# Patient Record
Sex: Female | Born: 1990 | Race: Black or African American | Hispanic: No | Marital: Single | Smoking: Former smoker
Health system: Southern US, Community
[De-identification: ages and names within clinical notes are randomized; demographics above are authoritative.]

## PROBLEM LIST (undated history)

## (undated) ENCOUNTER — Inpatient Hospital Stay (HOSPITAL_COMMUNITY): Payer: Self-pay

## (undated) DIAGNOSIS — I10 Essential (primary) hypertension: Secondary | ICD-10-CM

## (undated) DIAGNOSIS — N390 Urinary tract infection, site not specified: Secondary | ICD-10-CM

## (undated) DIAGNOSIS — R519 Headache, unspecified: Secondary | ICD-10-CM

## (undated) HISTORY — DX: Urinary tract infection, site not specified: N39.0

## (undated) HISTORY — DX: Headache, unspecified: R51.9

---

## 2008-07-05 DIAGNOSIS — N390 Urinary tract infection, site not specified: Secondary | ICD-10-CM

## 2008-07-05 HISTORY — DX: Urinary tract infection, site not specified: N39.0

## 2016-06-02 ENCOUNTER — Emergency Department (HOSPITAL_COMMUNITY): Payer: Medicaid - Out of State

## 2016-06-02 ENCOUNTER — Encounter (HOSPITAL_COMMUNITY): Payer: Self-pay | Admitting: Emergency Medicine

## 2016-06-02 ENCOUNTER — Emergency Department (HOSPITAL_COMMUNITY)
Admission: EM | Admit: 2016-06-02 | Discharge: 2016-06-02 | Disposition: A | Discharge status: Home / Self Care | Payer: Medicaid - Out of State | Attending: Emergency Medicine | Admitting: Emergency Medicine

## 2016-06-02 DIAGNOSIS — F17200 Nicotine dependence, unspecified, uncomplicated: Secondary | ICD-10-CM | POA: Insufficient documentation

## 2016-06-02 DIAGNOSIS — N72 Inflammatory disease of cervix uteri: Secondary | ICD-10-CM | POA: Insufficient documentation

## 2016-06-02 LAB — I-STAT CHEM 8, ED
BUN: 6 mg/dL (ref 6–20)
CALCIUM ION: 1.17 mmol/L (ref 1.15–1.40)
CHLORIDE: 103 mmol/L (ref 101–111)
CREATININE: 0.7 mg/dL (ref 0.44–1.00)
GLUCOSE: 113 mg/dL — AB (ref 65–99)
HCT: 36 % (ref 36.0–46.0)
Hemoglobin: 12.2 g/dL (ref 12.0–15.0)
POTASSIUM: 3.1 mmol/L — AB (ref 3.5–5.1)
Sodium: 142 mmol/L (ref 135–145)
TCO2: 27 mmol/L (ref 0–100)

## 2016-06-02 LAB — URINALYSIS, ROUTINE W REFLEX MICROSCOPIC
BILIRUBIN URINE: NEGATIVE
GLUCOSE, UA: NEGATIVE mg/dL
Hgb urine dipstick: NEGATIVE
KETONES UR: NEGATIVE mg/dL
LEUKOCYTES UA: NEGATIVE
NITRITE: NEGATIVE
PH: 6.5 (ref 5.0–8.0)
PROTEIN: NEGATIVE mg/dL
Specific Gravity, Urine: 1.029 (ref 1.005–1.030)

## 2016-06-02 LAB — WET PREP, GENITAL
SPERM: NONE SEEN
TRICH WET PREP: NONE SEEN
Yeast Wet Prep HPF POC: NONE SEEN

## 2016-06-02 LAB — CBC WITH DIFFERENTIAL/PLATELET
BASOS ABS: 0 10*3/uL (ref 0.0–0.1)
Basophils Relative: 0 %
EOS PCT: 3 %
Eosinophils Absolute: 0.1 10*3/uL (ref 0.0–0.7)
HEMATOCRIT: 35.6 % — AB (ref 36.0–46.0)
HEMOGLOBIN: 11.7 g/dL — AB (ref 12.0–15.0)
LYMPHS ABS: 2.2 10*3/uL (ref 0.7–4.0)
LYMPHS PCT: 47 %
MCH: 27.3 pg (ref 26.0–34.0)
MCHC: 32.9 g/dL (ref 30.0–36.0)
MCV: 83.2 fL (ref 78.0–100.0)
Monocytes Absolute: 0.5 10*3/uL (ref 0.1–1.0)
Monocytes Relative: 10 %
NEUTROS ABS: 2 10*3/uL (ref 1.7–7.7)
Neutrophils Relative %: 40 %
Platelets: 199 10*3/uL (ref 150–400)
RBC: 4.28 MIL/uL (ref 3.87–5.11)
RDW: 13.9 % (ref 11.5–15.5)
WBC: 4.9 10*3/uL (ref 4.0–10.5)

## 2016-06-02 LAB — POC URINE PREG, ED: Preg Test, Ur: NEGATIVE

## 2016-06-02 MED ORDER — METRONIDAZOLE 500 MG PO TABS: 500.0000 mg | ORAL_TABLET | Freq: Two times a day (BID) | ORAL | 0 refills | Status: DC

## 2016-06-02 MED ORDER — AZITHROMYCIN 250 MG PO TABS
1000.0000 mg | ORAL_TABLET | Freq: Once | ORAL | Status: AC
  Administered 2016-06-02: 1000 mg via ORAL
  Filled 2016-06-02: qty 4

## 2016-06-02 MED ORDER — LIDOCAINE HCL 1 % IJ SOLN
INTRAMUSCULAR | Status: AC
  Administered 2016-06-02: 1 mL
  Filled 2016-06-02: qty 20

## 2016-06-02 MED ORDER — CEFTRIAXONE SODIUM 250 MG IJ SOLR
250.0000 mg | Freq: Once | INTRAMUSCULAR | Status: AC
  Administered 2016-06-02: 250 mg via INTRAMUSCULAR
  Filled 2016-06-02: qty 250

## 2016-06-02 NOTE — ED Triage Notes (Signed)
Patient reports left side lower back pain radiating to left flank x3 days. Patient also reports vaginal discharge x1 month. Denies urinary frequency and pain with urination.

## 2016-06-02 NOTE — ED Provider Notes (Signed)
[facility]-EMERGENCY DEPT Provider Note   CSN: [id number] Arrival date & time: 06/02/16  1340     History   Chief Complaint Chief Complaint  Patient presents with  . Flank Pain  . Vaginal Discharge    HPI Tamara Cowan is a 25 y.o. female.  HPI Patient presents with left flank pain. Began around 5 days ago. Began the left lower back and worked its way to the abdomen. Worse with trying to sit up. Slight vaginal discharge. No pain. No dysuria. States it feels somewhat like when she had a kidney infection the past. She's had no dysuria. No nausea vomiting. No chills.   History reviewed. No pertinent past medical history.  There are no active problems to display for this patient.   History reviewed. No pertinent surgical history.  OB History    No data available       Home Medications    Prior to Admission medications   Medication Sig Start Date End Date Taking? Authorizing Provider  metroNIDAZOLE (FLAGYL) 500 MG tablet Take 1 tablet (500 mg total) by mouth 2 (two) times daily. 06/02/16   Benjiman CoreNathan Ginny Loomer, MD    Family History History reviewed. No pertinent family history.  Social History Social History  Substance Use Topics  . Smoking status: Light Tobacco Smoker  . Smokeless tobacco: Never Used  . Alcohol use Not on file     Allergies   Patient has no known allergies.   Review of Systems Review of Systems  Constitutional: Negative for appetite change and fever.  HENT: Negative for congestion.   Eyes: Negative for visual disturbance.  Respiratory: Negative for cough.   Cardiovascular: Negative for chest pain.  Gastrointestinal: Positive for abdominal pain.  Endocrine: Negative for polyuria.  Genitourinary: Positive for flank pain. Negative for hematuria.  Musculoskeletal: Positive for back pain.  Skin: Negative for wound.  Neurological: Negative for headaches.  Psychiatric/Behavioral: Negative for confusion.     Physical Exam Updated Vital  Signs BP 128/91   Pulse 74   Temp 98.5 F (36.9 C) (Oral)   Resp 18   Ht 5\' 7"  (1.702 m)   Wt 145 lb (65.8 kg)   LMP 03/22/2016   SpO2 100%   BMI 22.71 kg/m   Physical Exam  Constitutional: She is oriented to person, place, and time. She appears well-developed and well-nourished.  HENT:  Head: Normocephalic and atraumatic.  Eyes: Pupils are equal, round, and reactive to light.  Neck: Normal range of motion. Neck supple.  Cardiovascular: Normal rate, regular rhythm and normal heart sounds.   No murmur heard. Pulmonary/Chest: Effort normal and breath sounds normal. No respiratory distress. She has no wheezes. She has no rales.  Abdominal: Soft. Bowel sounds are normal. She exhibits no distension. There is tenderness. There is no guarding.  Mild left lower quadrant tenderness without rebound or guarding.  Genitourinary: Vaginal discharge found.  Genitourinary Comments: Thick white vaginal discharge. Some left adnexal tenderness. Somewhat difficult to visualize cervix as she is very posterior.  Musculoskeletal: Normal range of motion.  Neurological: She is alert and oriented to person, place, and time. No cranial nerve deficit.  Skin: Skin is warm and dry.  Psychiatric: She has a normal mood and affect. Her speech is normal.  Nursing note and vitals reviewed.    ED Treatments / Results  Labs (all labs ordered are listed, but only abnormal results are displayed) Labs Reviewed  WET PREP, GENITAL - Abnormal; Notable for the following:  Result Value   Clue Cells Wet Prep HPF POC PRESENT (*)    WBC, Wet Prep HPF POC MANY (*)    All other components within normal limits  URINALYSIS, ROUTINE W REFLEX MICROSCOPIC (NOT AT [facility]) - Abnormal; Notable for the following:    APPearance CLOUDY (*)    All other components within normal limits  CBC WITH DIFFERENTIAL/PLATELET - Abnormal; Notable for the following:    Hemoglobin 11.7 (*)    HCT 35.6 (*)    All other components within  normal limits  I-STAT CHEM 8, ED - Abnormal; Notable for the following:    Potassium 3.1 (*)    Glucose, Bld 113 (*)    All other components within normal limits  RPR  HIV ANTIBODY (ROUTINE TESTING)  POC URINE PREG, ED  GC/CHLAMYDIA PROBE AMP ([facility]) NOT AT [facility]    EKG  EKG Interpretation None       Radiology [place] Transvaginal Non-ob  Result Date: 06/02/2016 CLINICAL DATA:  Left lower quadrant pain and left flank pain for 3 days. EXAM: TRANSABDOMINAL AND TRANSVAGINAL ULTRASOUND OF PELVIS TECHNIQUE: Both transabdominal and transvaginal ultrasound examinations of the pelvis were performed. Transabdominal technique was performed for global imaging of the pelvis including uterus, ovaries, adnexal regions, and pelvic cul-de-sac. It was necessary to proceed with endovaginal exam following the transabdominal exam to visualize the endometrium. COMPARISON:  None FINDINGS: Uterus Measurements: 9.1 x 4.2 x 4.8 cm. No fibroids or other mass visualized. Endometrium Thickness: 4 mm.  No focal abnormality visualized. Right ovary Measurements: 3.9 x 2.0 x 2.8 cm. Normal appearance/no adnexal mass. Left ovary Measurements: 2.6 x 1.7 x 2.9 cm. Normal appearance/no adnexal mass. Other findings Small volume pelvic free fluid. IMPRESSION: 1. Normal appearance of the uterus and ovaries. 2. Small volume free fluid, likely physiologic. Electronically Signed   By: Sebastian AcheAllen  Grady M.D.   On: 06/02/2016 19:54   [place] Pelvis Complete  Result Date: 06/02/2016 CLINICAL DATA:  Left lower quadrant pain and left flank pain for 3 days. EXAM: TRANSABDOMINAL AND TRANSVAGINAL ULTRASOUND OF PELVIS TECHNIQUE: Both transabdominal and transvaginal ultrasound examinations of the pelvis were performed. Transabdominal technique was performed for global imaging of the pelvis including uterus, ovaries, adnexal regions, and pelvic cul-de-sac. It was necessary to proceed with endovaginal exam following the transabdominal exam to visualize  the endometrium. COMPARISON:  None FINDINGS: Uterus Measurements: 9.1 x 4.2 x 4.8 cm. No fibroids or other mass visualized. Endometrium Thickness: 4 mm.  No focal abnormality visualized. Right ovary Measurements: 3.9 x 2.0 x 2.8 cm. Normal appearance/no adnexal mass. Left ovary Measurements: 2.6 x 1.7 x 2.9 cm. Normal appearance/no adnexal mass. Other findings Small volume pelvic free fluid. IMPRESSION: 1. Normal appearance of the uterus and ovaries. 2. Small volume free fluid, likely physiologic. Electronically Signed   By: Sebastian AcheAllen  Grady M.D.   On: 06/02/2016 19:54    Procedures Procedures (including critical care time)  Medications Ordered in ED Medications  cefTRIAXone (ROCEPHIN) injection 250 mg (not administered)  azithromycin (ZITHROMAX) tablet 1,000 mg (not administered)     Initial Impression / Assessment and Plan / ED Course  I have reviewed the triage vital signs and the nursing notes.  Pertinent labs & imaging results that were available during my care of the patient were reviewed by me and considered in my medical decision making (see chart for details).  Clinical Course     Patient with back to pelvic pain. Does have vaginal discharge. Possible BV and many  white cells on pelvic exam. Treated for STD culture sent. Labs reassuring. Doubt other severe intra-abdominal infection. Discharge.  Final Clinical Impressions(s) / ED Diagnoses   Final diagnoses:  Cervicitis    New Prescriptions New Prescriptions   METRONIDAZOLE (FLAGYL) 500 MG TABLET    Take 1 tablet (500 mg total) by mouth 2 (two) times daily.     Benjiman Core, MD 06/02/16 2107

## 2016-06-02 NOTE — Discharge Instructions (Signed)
We treated you to cover STDs however cultures are still pending.

## 2016-06-03 LAB — GC/CHLAMYDIA PROBE AMP (~~LOC~~) NOT AT ARMC
Chlamydia: NEGATIVE
Neisseria Gonorrhea: NEGATIVE

## 2016-06-03 LAB — RPR: RPR: NONREACTIVE

## 2016-06-03 LAB — HIV ANTIBODY (ROUTINE TESTING W REFLEX): HIV SCREEN 4TH GENERATION: NONREACTIVE

## 2016-06-28 ENCOUNTER — Inpatient Hospital Stay (HOSPITAL_COMMUNITY)
Admission: AD | Admit: 2016-06-28 | Discharge: 2016-06-28 | Disposition: A | Discharge status: Home / Self Care | Payer: Medicaid - Out of State | Source: Ambulatory Visit | Attending: Obstetrics & Gynecology | Admitting: Obstetrics & Gynecology

## 2016-06-28 ENCOUNTER — Encounter (HOSPITAL_COMMUNITY): Payer: Self-pay | Admitting: Emergency Medicine

## 2016-06-28 ENCOUNTER — Emergency Department (HOSPITAL_COMMUNITY)
Admission: EM | Admit: 2016-06-28 | Discharge: 2016-06-28 | Disposition: A | Discharge status: Home / Self Care | Payer: Medicaid - Out of State | Attending: Emergency Medicine | Admitting: Emergency Medicine

## 2016-06-28 DIAGNOSIS — Z5321 Procedure and treatment not carried out due to patient leaving prior to being seen by health care provider: Secondary | ICD-10-CM | POA: Insufficient documentation

## 2016-06-28 DIAGNOSIS — Z3201 Encounter for pregnancy test, result positive: Secondary | ICD-10-CM | POA: Insufficient documentation

## 2016-06-28 DIAGNOSIS — F17200 Nicotine dependence, unspecified, uncomplicated: Secondary | ICD-10-CM | POA: Insufficient documentation

## 2016-06-28 LAB — POC URINE PREG, ED: PREG TEST UR: POSITIVE — AB

## 2016-06-28 NOTE — Discharge Instructions (Signed)
You had a positive pregnancy test today. You will need to follow up with OB/GYN as soon as possible. Begin taking prenatal vitamins as soon as possible. These are available over-the-counter. The emergency department cannot offer any prenatal care.

## 2016-06-28 NOTE — MAU Provider Note (Signed)
Tamara GrumblesMiyesha Cowan 25 y.o.  Had a positive pregnancy test at home and wants a confirmation from a more accurate pregnancy test. Is not having any problems today - no abdominal pain and no vaginal bleeding. [facility] and was told by the nurse on call to come here for a pregnancy test. Explained that we are not doing pregnancy verifications in the emergency room and that she can walk in at the Clinic downstairs for a pregnancy test tomorrow or later this week. Client was OK with the info.  The female friend accompanying her was frustrated that the test would not be done today. [name], NP

## 2016-06-28 NOTE — ED Notes (Signed)
Bed: WTR5 Expected date:  Expected time:  Means of arrival:  Comments: 

## 2016-06-28 NOTE — MAU Note (Signed)
Pt took a pregnancy test today and it was positive, denies pain or bleeding.

## 2016-06-28 NOTE — ED Triage Notes (Signed)
Per pt, states she took pregnancy test today and it was positive, states she wants to see if it was right and to see if everything was "alright" [facility] and [facility] said they couldn't do anything for her

## 2016-06-29 NOTE — ED Provider Notes (Signed)
[facility]-EMERGENCY DEPT Provider Note   CSN: [id number] Arrival date & time: 06/28/16  1812     History   Chief Complaint Chief Complaint  Patient presents with  . Possible Pregnancy    HPI Tamara Cowan is a 25 y.o. female.  HPI   Tamara GrumblesMiyesha Fatula is a 25 y.o. female, Patient with no pertinent past medical history, presenting to the ED with a positive home pregnancy test. Patient requests a repeat, confirmatory test. Patient endorses breast tenderness for the last few weeks. LMP was in October, but patient states she is irregular. Denies abdominal pain, vomiting, fever, vaginal bleeding or discharge, or any other complaints.    History reviewed. No pertinent past medical history.  There are no active problems to display for this patient.   History reviewed. No pertinent surgical history.  OB History    No data available       Home Medications    Prior to Admission medications   Medication Sig Start Date End Date Taking? Authorizing Provider  metroNIDAZOLE (FLAGYL) 500 MG tablet Take 1 tablet (500 mg total) by mouth 2 (two) times daily. 06/02/16   Benjiman CoreNathan Pickering, MD    Family History No family history on file.  Social History Social History  Substance Use Topics  . Smoking status: Light Tobacco Smoker  . Smokeless tobacco: Never Used  . Alcohol use Not on file     Allergies   Patient has no known allergies.   Review of Systems Review of Systems  Constitutional: Negative for fever.  Gastrointestinal: Negative for abdominal pain, nausea and vomiting.  Genitourinary: Negative for vaginal bleeding and vaginal discharge.       Positive pregnancy test     Physical Exam Updated Vital Signs BP 112/83 (BP Location: Left Arm)   Pulse 92   Temp 98.2 F (36.8 C) (Oral)   Resp 18   LMP 04/09/2016   SpO2 100%   Physical Exam  Constitutional: She appears well-developed and well-nourished. No distress.  HENT:  Head: Normocephalic and atraumatic.  Eyes:  Conjunctivae are normal.  Neck: Neck supple.  Cardiovascular: Normal rate and regular rhythm.   Pulmonary/Chest: Effort normal.  Neurological: She is alert.  Skin: Skin is warm and dry. She is not diaphoretic.  Psychiatric: She has a normal mood and affect. Her behavior is normal.  Nursing note and vitals reviewed.    ED Treatments / Results  Labs (all labs ordered are listed, but only abnormal results are displayed) Labs Reviewed  POC URINE PREG, ED - Abnormal; Notable for the following:       Result Value   Preg Test, Ur POSITIVE (*)    All other components within normal limits    EKG  EKG Interpretation None       Radiology No results found.  Procedures Procedures (including critical care time)  Medications Ordered in ED Medications - No data to display   Initial Impression / Assessment and Plan / ED Course  I have reviewed the triage vital signs and the nursing notes.  Pertinent labs & imaging results that were available during my care of the patient were reviewed by me and considered in my medical decision making (see chart for details).  Clinical Course     Positive pregnancy test here in the ED. This news was delivered to the patient. Recommended OB/GYN follow-up. Resources given.    Final Clinical Impressions(s) / ED Diagnoses   Final diagnoses:  Positive pregnancy test    New Prescriptions  Discharge Medication List as of 06/28/2016  6:59 PM       Anselm PancoastShawn C Dequavion Follette, PA-C 06/29/16 0110    Rolan BuccoMelanie Belfi, MD 07/04/16 [phone]

## 2016-07-04 ENCOUNTER — Inpatient Hospital Stay (HOSPITAL_COMMUNITY): Payer: Medicaid - Out of State

## 2016-07-04 ENCOUNTER — Encounter (HOSPITAL_COMMUNITY): Payer: Self-pay | Admitting: *Deleted

## 2016-07-04 ENCOUNTER — Inpatient Hospital Stay (HOSPITAL_COMMUNITY)
Admission: AD | Admit: 2016-07-04 | Discharge: 2016-07-04 | Disposition: A | Discharge status: Home / Self Care | Payer: Self-pay | Source: Ambulatory Visit | Attending: Obstetrics and Gynecology | Admitting: Obstetrics and Gynecology

## 2016-07-04 DIAGNOSIS — Z87891 Personal history of nicotine dependence: Secondary | ICD-10-CM | POA: Insufficient documentation

## 2016-07-04 DIAGNOSIS — B373 Candidiasis of vulva and vagina: Secondary | ICD-10-CM

## 2016-07-04 DIAGNOSIS — O98811 Other maternal infectious and parasitic diseases complicating pregnancy, first trimester: Secondary | ICD-10-CM

## 2016-07-04 DIAGNOSIS — O4691 Antepartum hemorrhage, unspecified, first trimester: Secondary | ICD-10-CM | POA: Insufficient documentation

## 2016-07-04 DIAGNOSIS — O209 Hemorrhage in early pregnancy, unspecified: Secondary | ICD-10-CM

## 2016-07-04 DIAGNOSIS — O23591 Infection of other part of genital tract in pregnancy, first trimester: Secondary | ICD-10-CM | POA: Insufficient documentation

## 2016-07-04 DIAGNOSIS — N760 Acute vaginitis: Secondary | ICD-10-CM

## 2016-07-04 DIAGNOSIS — Z3491 Encounter for supervision of normal pregnancy, unspecified, first trimester: Secondary | ICD-10-CM

## 2016-07-04 DIAGNOSIS — B9689 Other specified bacterial agents as the cause of diseases classified elsewhere: Secondary | ICD-10-CM | POA: Insufficient documentation

## 2016-07-04 DIAGNOSIS — Z3A12 12 weeks gestation of pregnancy: Secondary | ICD-10-CM

## 2016-07-04 DIAGNOSIS — B3731 Acute candidiasis of vulva and vagina: Secondary | ICD-10-CM

## 2016-07-04 LAB — URINALYSIS, ROUTINE W REFLEX MICROSCOPIC
BILIRUBIN URINE: NEGATIVE
Glucose, UA: NEGATIVE mg/dL
Hgb urine dipstick: NEGATIVE
Ketones, ur: NEGATIVE mg/dL
LEUKOCYTES UA: NEGATIVE
NITRITE: NEGATIVE
Protein, ur: NEGATIVE mg/dL
SPECIFIC GRAVITY, URINE: 1.023 (ref 1.005–1.030)
pH: 7 (ref 5.0–8.0)

## 2016-07-04 LAB — WET PREP, GENITAL
Sperm: NONE SEEN
Trich, Wet Prep: NONE SEEN

## 2016-07-04 LAB — CBC
HEMATOCRIT: 34.2 % — AB (ref 36.0–46.0)
HEMOGLOBIN: 11.7 g/dL — AB (ref 12.0–15.0)
MCH: 27.4 pg (ref 26.0–34.0)
MCHC: 34.2 g/dL (ref 30.0–36.0)
MCV: 80.1 fL (ref 78.0–100.0)
Platelets: 221 10*3/uL (ref 150–400)
RBC: 4.27 MIL/uL (ref 3.87–5.11)
RDW: 13.6 % (ref 11.5–15.5)
WBC: 5.7 10*3/uL (ref 4.0–10.5)

## 2016-07-04 LAB — POCT PREGNANCY, URINE: PREG TEST UR: POSITIVE — AB

## 2016-07-04 LAB — ABO/RH: ABO/RH(D): O POS

## 2016-07-04 LAB — HCG, QUANTITATIVE, PREGNANCY: HCG, BETA CHAIN, QUANT, S: 44844 m[IU]/mL — AB (ref <5)

## 2016-07-04 MED ORDER — TERCONAZOLE 0.8 % VA CREA: 1.0000 | TOPICAL_CREAM | Freq: Every day | VAGINAL | 0 refills | Status: DC

## 2016-07-04 MED ORDER — METRONIDAZOLE 500 MG PO TABS: 500.0000 mg | ORAL_TABLET | Freq: Two times a day (BID) | ORAL | 0 refills | Status: DC

## 2016-07-04 NOTE — MAU Note (Signed)
Patient had 2 positive UPTs woke up this morning with some bleeding, only sees on toilet tissue after wiping, light cramping.

## 2016-07-04 NOTE — MAU Provider Note (Signed)
History     CSN: [id number]  Arrival date and time: 07/04/16 1326   First Provider Initiated Contact with Patient 07/04/16 1444      Chief Complaint  Patient presents with  . Vaginal Bleeding  . Abdominal Cramping   HPI  Tamara Cowan is a 25 y.o. G3P0020 at 1796w2d by unsure LMP who presents with vaginal bleeding & abdominal cramping. Symptoms began this morning around 1 am. Has noted pink spotting on toilet paper; not bleeding into pad. Lower abdominal cramping is intermittent. Rates pain 3/10. Has not treated. Denies n/v/d, constipation, dysuria, recent intercourse, or vaginal discharge.   OB History    Gravida Para Term Preterm AB Living   3       2     SAB TAB Ectopic Multiple Live Births   1 1     0      Past Medical History:  Diagnosis Date  . Medical history non-contributory     Past Surgical History:  Procedure Laterality Date  . NO PAST SURGERIES      No family history on file.  Social History  Substance Use Topics  . Smoking status: Former Games developermoker  . Smokeless tobacco: Never Used  . Alcohol use No    Allergies: No Known Allergies  Prescriptions Prior to Admission  Medication Sig Dispense Refill Last Dose  . Prenatal Vit-Fe Fumarate-FA (PRENATAL MULTIVITAMIN) TABS tablet Take 1 tablet by mouth daily.   07/04/2016 at Unknown time    Review of Systems  Constitutional: Negative.   Gastrointestinal: Positive for abdominal pain. Negative for constipation, diarrhea, nausea and vomiting.  Genitourinary: Negative for dysuria.       + vaginal bleeding (spotting)   Physical Exam   Blood pressure 122/78, pulse 83, temperature 98.5 F (36.9 C), temperature source Oral, resp. rate 18, height 5\' 7"  (1.702 m), weight 142 lb (64.4 kg), last menstrual period 04/09/2016.  Physical Exam  Nursing note and vitals reviewed. Constitutional: She is oriented to person, place, and time. She appears well-developed and well-nourished. No distress.  HENT:  Head:  Normocephalic and atraumatic.  Eyes: Conjunctivae are normal. Right eye exhibits no discharge. Left eye exhibits no discharge. No scleral icterus.  Neck: Normal range of motion.  Cardiovascular: Normal rate, regular rhythm and normal heart sounds.   No murmur heard. Respiratory: Effort normal and breath sounds normal. No respiratory distress. She has no wheezes.  GI: Soft. Bowel sounds are normal. She exhibits no distension. There is no tenderness. There is no rebound and no guarding.  Genitourinary: Uterus normal. Cervix exhibits no motion tenderness and no friability. No bleeding in the vagina. Vaginal discharge (small amount of clumpy white discharge adherant to vaginal wall & thin tan discharge) found.  Genitourinary Comments: Cervix closed  Neurological: She is alert and oriented to person, place, and time.  Skin: Skin is warm and dry. She is not diaphoretic.  Psychiatric: She has a normal mood and affect. Her behavior is normal. Judgment and thought content normal.    MAU Course  Procedures Results for orders placed or performed during the hospital encounter of 07/04/16 (from the past 24 hour(s))  Urinalysis, Routine w reflex microscopic     Status: None   Collection Time: 07/04/16  1:40 PM  Result Value Ref Range   Color, Urine YELLOW YELLOW   APPearance CLEAR CLEAR   Specific Gravity, Urine 1.023 1.005 - 1.030   pH 7.0 5.0 - 8.0   Glucose, UA NEGATIVE NEGATIVE mg/dL  Hgb urine dipstick NEGATIVE NEGATIVE   Bilirubin Urine NEGATIVE NEGATIVE   Ketones, ur NEGATIVE NEGATIVE mg/dL   Protein, ur NEGATIVE NEGATIVE mg/dL   Nitrite NEGATIVE NEGATIVE   Leukocytes, UA NEGATIVE NEGATIVE  Pregnancy, urine POC     Status: Abnormal   Collection Time: 07/04/16  1:55 PM  Result Value Ref Range   Preg Test, Ur POSITIVE (A) NEGATIVE  CBC     Status: Abnormal   Collection Time: 07/04/16  2:12 PM  Result Value Ref Range   WBC 5.7 4.0 - 10.5 K/uL   RBC 4.27 3.87 - 5.11 MIL/uL   Hemoglobin  11.7 (L) 12.0 - 15.0 g/dL   HCT 16.1 (L) 09.6 - 04.5 %   MCV 80.1 78.0 - 100.0 fL   MCH 27.4 26.0 - 34.0 pg   MCHC 34.2 30.0 - 36.0 g/dL   RDW 40.9 81.1 - 91.4 %   Platelets 221 150 - 400 K/uL  ABO/Rh     Status: None (Preliminary result)   Collection Time: 07/04/16  2:12 PM  Result Value Ref Range   ABO/RH(D) O POS   hCG, quantitative, pregnancy     Status: Abnormal   Collection Time: 07/04/16  2:12 PM  Result Value Ref Range   hCG, Beta Chain, Quant, S 44,844 (H) <5 mIU/mL  Wet prep, genital     Status: Abnormal   Collection Time: 07/04/16  2:54 PM  Result Value Ref Range   Yeast Wet Prep HPF POC PRESENT (A) NONE SEEN   Trich, Wet Prep NONE SEEN NONE SEEN   Clue Cells Wet Prep HPF POC PRESENT (A) NONE SEEN   WBC, Wet Prep HPF POC MODERATE (A) NONE SEEN   Sperm NONE SEEN    US Ob Comp Less 14 Wks  Result Date: 07/04/2016 CLINICAL DATA:  Bleeding since this morning.  Patient is pregnant. EXAM: OBSTETRIC <14 WK [place] AND TRANSVAGINAL OB US TECHNIQUE: Both transabdominal and transvaginal ultrasound examinations were performed for complete evaluation of the gestation as well as the maternal uterus, adnexal regions, and pelvic cul-de-sac. Transvaginal technique was performed to assess early pregnancy. COMPARISON:  None. FINDINGS: Intrauterine gestational sac: Single Yolk sac:  Present Embryo:  Present Cardiac Activity: Present Heart Rate: 121  bpm MSD:   mm    w     d CRL:  5.6  mm   6 w   2 d                  [place] [facility]: February 25, 2017 Subchorionic hemorrhage:  None visualized. Maternal uterus/adnexae: The ovaries are normal. There is a small amount of free fluid. IMPRESSION: Single live intrauterine gestation measuring to 6 weeks and 2 days. Electronically Signed   By: Sherian Rein M.D.   On: 07/04/2016 15:36   US Ob Transvaginal  Result Date: 07/04/2016 CLINICAL DATA:  Bleeding since this morning.  Patient is pregnant. EXAM: OBSTETRIC <14 WK [place] AND TRANSVAGINAL OB US TECHNIQUE: Both  transabdominal and transvaginal ultrasound examinations were performed for complete evaluation of the gestation as well as the maternal uterus, adnexal regions, and pelvic cul-de-sac. Transvaginal technique was performed to assess early pregnancy. COMPARISON:  None. FINDINGS: Intrauterine gestational sac: Single Yolk sac:  Present Embryo:  Present Cardiac Activity: Present Heart Rate: 121  bpm MSD:   mm    w     d CRL:  5.6  mm   6 w   2 d  US [facility]: February 25, 2017 Subchorionic hemorrhage:  None visualized. Maternal uterus/adnexae: The ovaries are normal. There is a small amount of free fluid. IMPRESSION: Single live intrauterine gestation measuring to 6 weeks and 2 days. Electronically Signed   By: Sherian ReinWei-Chen  Lin M.D.   On: 07/04/2016 15:36    MDM +UPT UA, wet prep, GC/chlamydia, CBC, ABO/Rh, quant hCG, HIV, and US today to rule out ectopic pregnancy O positive Ultrasound shows SIUP with cardiac activity Assessment and Plan  A: 1. Vaginal yeast infection   2. Vaginal bleeding in pregnancy, first trimester   3. BV (bacterial vaginosis)   4. Normal IUP (intrauterine pregnancy) on prenatal ultrasound, first trimester    P: Discharge home Rx flagyl & terazol Pelvic rest GC/CT pending Start prenatal care (appt scheduled later this month) Discussed reasons to return to [facility]U  Judeth HornErin Amarrah Meinhart 07/04/2016, 2:11 PM

## 2016-07-04 NOTE — Discharge Instructions (Signed)
Bacterial Vaginosis Bacterial vaginosis is an infection of the vagina. It happens when too many germs (bacteria) grow in the vagina. This infection puts you at risk for infections from sex (STIs). Treating this infection can lower your risk for some STIs. You should also treat this if you are pregnant. It can cause your baby to be born early. Follow these instructions at home: Medicines  Take over-the-counter and prescription medicines only as told by your doctor.  Take or use your antibiotic medicine as told by your doctor. Do not stop taking or using it even if you start to feel better. General instructions  If you your sexual partner is a woman, tell her that you have this infection. She needs to get treatment if she has symptoms. If you have a female partner, he does not need to be treated.  During treatment:  Avoid sex.  Do not douche.  Avoid alcohol as told.  Avoid breastfeeding as told.  Drink enough fluid to keep your pee (urine) clear or pale yellow.  Keep your vagina and butt (rectum) clean.  Wash the area with warm water every day.  Wipe from front to back after you use the toilet.  Keep all follow-up visits as told by your doctor. This is important. Preventing this condition  Do not douche.  Use only warm water to wash around your vagina.  Use protection when you have sex. This includes:  Latex condoms.  Dental dams.  Limit how many people you have sex with. It is best to only have sex with the same person (be monogamous).  Get tested for STIs. Have your partner get tested.  Wear underwear that is cotton or lined with cotton.  Avoid tight pants and pantyhose. This is most important in summer.  Do not use any products that have nicotine or tobacco in them. These include cigarettes and e-cigarettes. If you need help quitting, ask your doctor.  Do not use illegal drugs.  Limit how much alcohol you drink. Contact a doctor if:  Your symptoms do not get  better, even after you are treated.  You have more discharge or pain when you pee (urinate).  You have a fever.  You have pain in your belly (abdomen).  You have pain with sex.  Your bleed from your vagina between periods. Summary  This infection happens when too many germs (bacteria) grow in the vagina.  Treating this condition can lower your risk for some infections from sex (STIs).  You should also treat this if you are pregnant. It can cause early (premature) birth.  Do not stop taking or using your antibiotic medicine even if you start to feel better. This information is not intended to replace advice given to you by your health care provider. Make sure you discuss any questions you have with your health care provider. Document Released: 03/30/2008 Document Revised: 03/06/2016 Document Reviewed: 03/06/2016 Elsevier Interactive Patient Education  2017 [employer] Inc.  Vaginal Bleeding During Pregnancy, First Trimester A small amount of bleeding (spotting) from the vagina is common in early pregnancy. Sometimes the bleeding is normal and is not a problem, and sometimes it is a sign of something serious. Be sure to tell your doctor about any bleeding from your vagina right away. Follow these instructions at home:  Watch your condition for any changes.  Follow your doctor's instructions about how active you can be.  If you are on bed rest:  You may need to stay in bed and only get  up to use the bathroom.  You may be allowed to do some activities.  If you need help, make plans for someone to help you.  Write down:  The number of pads you use each day.  How often you change pads.  How soaked (saturated) your pads are.  Do not use tampons.  Do not douche.  Do not have sex or orgasms until your doctor says it is okay.  If you pass any tissue from your vagina, save the tissue so you can show it to your doctor.  Only take medicines as told by your doctor.  Do not  take aspirin because it can make you bleed.  Keep all follow-up visits as told by your doctor. Contact a doctor if:  You bleed from your vagina.  You have cramps.  You have labor pains.  You have a fever that does not go away after you take medicine. Get help right away if:  You have very bad cramps in your back or belly (abdomen).  You pass large clots or tissue from your vagina.  You bleed more.  You feel light-headed or weak.  You pass out (faint).  You have chills.  You are leaking fluid or have a gush of fluid from your vagina.  You pass out while pooping (having a bowel movement). This information is not intended to replace advice given to you by your health care provider. Make sure you discuss any questions you have with your health care provider. Document Released: 11/05/2013 Document Revised: 11/27/2015 Document Reviewed: 02/26/2013 Elsevier Interactive Patient Education  2017 Elsevier Inc.  Vaginal Yeast infection, Adult Vaginal yeast infection is a condition that causes soreness, swelling, and redness (inflammation) of the vagina. It also causes vaginal discharge. This is a common condition. Some women get this infection frequently. What are the causes? This condition is caused by a change in the normal balance of the yeast (candida) and bacteria that live in the vagina. This change causes an overgrowth of yeast, which causes the inflammation. What increases the risk? This condition is more likely to develop in:  Women who take antibiotic medicines.  Women who have diabetes.  Women who take birth control pills.  Women who are pregnant.  Women who douche often.  Women who have a weak defense (immune) system.  Women who have been taking steroid medicines for a long time.  Women who frequently wear tight clothing. What are the signs or symptoms? Symptoms of this condition include:  White, thick vaginal discharge.  Swelling, itching, redness, and  irritation of the vagina. The lips of the vagina (vulva) may be affected as well.  Pain or a burning feeling while urinating.  Pain during sex. How is this diagnosed? This condition is diagnosed with a medical history and physical exam. This will include a pelvic exam. Your health care provider will examine a sample of your vaginal discharge under a microscope. Your health care provider may send this sample for testing to confirm the diagnosis. How is this treated? This condition is treated with medicine. Medicines may be over-the-counter or prescription. You may be told to use one or more of the following:  Medicine that is taken orally.  Medicine that is applied as a cream.  Medicine that is inserted directly into the vagina (suppository). Follow these instructions at home:  Take or apply over-the-counter and prescription medicines only as told by your health care provider.  Do not have sex until your health care provider has approved.  Tell your sex partner that you have a yeast infection. That person should go to his or her health care provider if he or she develops symptoms.  Do not wear tight clothes, such as pantyhose or tight pants.  Avoid using tampons until your health care provider approves.  Eat more yogurt. This may help to keep your yeast infection from returning.  Try taking a sitz bath to help with discomfort. This is a warm water bath that is taken while you are sitting down. The water should only come up to your hips and should cover your buttocks. Do this 3-4 times per day or as told by your health care provider.  Do not douche.  Wear breathable, cotton underwear.  If you have diabetes, keep your blood sugar levels under control. Contact a health care provider if:  You have a fever.  Your symptoms go away and then return.  Your symptoms do not get better with treatment.  Your symptoms get worse.  You have new symptoms.  You develop blisters in or around  your vagina.  You have blood coming from your vagina and it is not your menstrual period.  You develop pain in your abdomen. This information is not intended to replace advice given to you by your health care provider. Make sure you discuss any questions you have with your health care provider. Document Released: 03/31/2005 Document Revised: 12/03/2015 Document Reviewed: 12/23/2014 Elsevier Interactive Patient Education  2017 [employer]

## 2016-07-05 LAB — HIV ANTIBODY (ROUTINE TESTING W REFLEX): HIV Screen 4th Generation wRfx: NONREACTIVE

## 2016-07-06 LAB — GC/CHLAMYDIA PROBE AMP (~~LOC~~) NOT AT ARMC
CHLAMYDIA, DNA PROBE: NEGATIVE
NEISSERIA GONORRHEA: NEGATIVE

## 2016-07-08 ENCOUNTER — Telehealth: Payer: Self-pay

## 2016-07-08 NOTE — Telephone Encounter (Signed)
Attempted to call patient regarding test results. All STD are negative at this time.

## 2016-08-23 LAB — OB RESULTS CONSOLE ANTIBODY SCREEN: Antibody Screen: NEGATIVE

## 2016-08-23 LAB — OB RESULTS CONSOLE RPR: RPR: NONREACTIVE

## 2016-08-23 LAB — OB RESULTS CONSOLE ABO/RH: RH TYPE: POSITIVE

## 2016-08-23 LAB — CYTOLOGY - PAP
Cystic Fibrosis Profile: NEGATIVE
GLUCOSE 1 HR PRENATAL, POC: 62 mg/dL
URINE CULTURE, OB: NEGATIVE

## 2016-08-23 LAB — OB RESULTS CONSOLE RUBELLA ANTIBODY, IGM: RUBELLA: IMMUNE

## 2016-08-23 LAB — OB RESULTS CONSOLE HEPATITIS B SURFACE ANTIGEN: HEP B S AG: NEGATIVE

## 2016-08-23 LAB — OB RESULTS CONSOLE HGB/HCT, BLOOD
HCT: 36 %
Hemoglobin: 11.6 g/dL

## 2016-08-23 LAB — OB RESULTS CONSOLE GC/CHLAMYDIA
Chlamydia: NEGATIVE
Gonorrhea: NEGATIVE

## 2016-08-23 LAB — OB RESULTS CONSOLE HIV ANTIBODY (ROUTINE TESTING): HIV: NONREACTIVE

## 2016-08-23 LAB — OB RESULTS CONSOLE PLATELET COUNT: Platelets: 217 10*3/uL

## 2016-08-23 LAB — OB RESULTS CONSOLE VARICELLA ZOSTER ANTIBODY, IGG: Varicella: IMMUNE

## 2016-08-24 ENCOUNTER — Encounter (HOSPITAL_COMMUNITY): Payer: Self-pay

## 2016-08-24 ENCOUNTER — Other Ambulatory Visit (HOSPITAL_COMMUNITY): Payer: Self-pay | Admitting: Nurse Practitioner

## 2016-08-24 DIAGNOSIS — O09291 Supervision of pregnancy with other poor reproductive or obstetric history, first trimester: Secondary | ICD-10-CM

## 2016-08-24 DIAGNOSIS — Z3A14 14 weeks gestation of pregnancy: Secondary | ICD-10-CM

## 2016-08-27 ENCOUNTER — Encounter (HOSPITAL_COMMUNITY): Payer: Self-pay

## 2016-08-27 ENCOUNTER — Other Ambulatory Visit (HOSPITAL_COMMUNITY): Payer: Self-pay | Admitting: Nurse Practitioner

## 2016-08-27 ENCOUNTER — Ambulatory Visit (HOSPITAL_COMMUNITY)
Admission: RE | Admit: 2016-08-27 | Discharge: 2016-08-27 | Disposition: A | Discharge status: Home / Self Care | Payer: Medicaid Other | Source: Ambulatory Visit | Attending: Nurse Practitioner | Admitting: Nurse Practitioner

## 2016-08-27 ENCOUNTER — Ambulatory Visit (HOSPITAL_COMMUNITY): Payer: Self-pay

## 2016-08-27 DIAGNOSIS — Z3A14 14 weeks gestation of pregnancy: Secondary | ICD-10-CM

## 2016-08-27 DIAGNOSIS — O09291 Supervision of pregnancy with other poor reproductive or obstetric history, first trimester: Secondary | ICD-10-CM

## 2016-09-07 ENCOUNTER — Encounter: Payer: Self-pay | Admitting: *Deleted

## 2016-09-08 DIAGNOSIS — O0990 Supervision of high risk pregnancy, unspecified, unspecified trimester: Secondary | ICD-10-CM | POA: Insufficient documentation

## 2016-09-08 DIAGNOSIS — R87810 Cervical high risk human papillomavirus (HPV) DNA test positive: Secondary | ICD-10-CM

## 2016-09-08 DIAGNOSIS — N883 Incompetence of cervix uteri: Secondary | ICD-10-CM | POA: Insufficient documentation

## 2016-09-08 DIAGNOSIS — R87610 Atypical squamous cells of undetermined significance on cytologic smear of cervix (ASC-US): Secondary | ICD-10-CM | POA: Insufficient documentation

## 2016-09-08 NOTE — Progress Notes (Signed)
[facility] Patient declined flu vaccine.

## 2016-09-09 ENCOUNTER — Ambulatory Visit (INDEPENDENT_AMBULATORY_CARE_PROVIDER_SITE_OTHER): Payer: Medicaid Other | Admitting: Obstetrics and Gynecology

## 2016-09-09 ENCOUNTER — Encounter: Payer: Self-pay | Admitting: Obstetrics and Gynecology

## 2016-09-09 VITALS — BP 101/63 | HR 84 | Wt 149.4 lb

## 2016-09-09 DIAGNOSIS — O0992 Supervision of high risk pregnancy, unspecified, second trimester: Secondary | ICD-10-CM

## 2016-09-09 DIAGNOSIS — N883 Incompetence of cervix uteri: Secondary | ICD-10-CM

## 2016-09-09 DIAGNOSIS — O3432 Maternal care for cervical incompetence, second trimester: Secondary | ICD-10-CM | POA: Diagnosis not present

## 2016-09-09 NOTE — Progress Notes (Signed)
Declines flu Recently treated for BV and yeast, still has white clumpy discharge

## 2016-09-09 NOTE — Progress Notes (Signed)
Prenatal Visit Note Date: 09/09/2016 [facility]: [facility]  [facility] transfer NOB visit.   Subjective:  Lennox GrumblesMiyesha Morino is a 26 y.o. G3P0110 at 571w6d being seen today for ongoing prenatal care.  She is currently monitored for the following issues for this high-risk pregnancy and has Incompetent cervix; Supervision of high-risk pregnancy; and ASCUS with positive high risk HPV cervical on her problem list.  Patient reports mild, irregular low belly discomfort.   . Vag. Bleeding: None.  Movement: Absent. Denies leaking of fluid.   The following portions of the patient's history were reviewed and updated as appropriate: allergies, current medications, past family history, past medical history, past social history, past surgical history and problem list. Problem list updated.  Objective:   Vitals:   09/09/16 1247  BP: 101/63  Pulse: 84  Weight: 149 lb 6.4 oz (67.8 kg)    Fetal Status: Fetal Heart Rate (bpm): 160   Movement: Absent     General:  Alert, oriented and cooperative. Patient is in no acute distress.  Skin: Skin is warm and dry. No rash noted.   Cardiovascular: Normal heart rate noted  Respiratory: Normal respiratory effort, no problems with respiration noted  Abdomen: Soft, gravid, appropriate for gestational age. Pain/Pressure: Present     Pelvic:  closed/?50%/high  Extremities: Normal range of motion.  Edema: None  Mental Status: Normal mood and affect. Normal behavior. Normal judgment and thought content.   Urinalysis:      Assessment and Plan:  Pregnancy: G3P0110 at 3071w6d  1. Supervision of high risk pregnancy in second trimester Routine care. UCx for low belly discomfort. Pt amenable to quad screen today.  - Urine Culture - AFP, Quad Screen  2. Incompetent cervix History reviewed with patient and she states that it was 2010 and at approx 1834m and that she had spotting (no pain) so went to the hospital and she was diagnosed based on an u/s. She states  that she doesn't believe she was offered any interventions and then subsequently had PTB. Had anatomy u/s at 15wks on 2/23 and had TAUS CL of 2.7cm with mfm; no e/o funneling. They had her sign release for records and plans for f/u CL, which is tomorrow. [facility] or able to find in care everywhere. D/w her that plan of care is really determined by u/s tomorrow. If e/o cx shortening, then could offer vag progesterone and/or rescue cerclage. If normal CL, then could offer ppx cerclage and/or 17p. Pt told to be NPO p MN in case u/s findings show possibility of surgical interventiion tomorrow.  Pt amenable to plan.  Preterm labor symptoms and general obstetric precautions including but not limited to vaginal bleeding, contractions, leaking of fluid and fetal movement were reviewed in detail with the patient. Please refer to After Visit Summary for other counseling recommendations.  Return in about 1 week (around 09/16/2016) for rob.   Mather Bingharlie Kafi Dotter, MD

## 2016-09-10 ENCOUNTER — Other Ambulatory Visit (HOSPITAL_COMMUNITY): Payer: Self-pay | Admitting: Maternal and Fetal Medicine

## 2016-09-10 ENCOUNTER — Other Ambulatory Visit: Payer: Self-pay | Admitting: Obstetrics & Gynecology

## 2016-09-10 ENCOUNTER — Encounter (HOSPITAL_COMMUNITY): Payer: Self-pay

## 2016-09-10 ENCOUNTER — Encounter (HOSPITAL_COMMUNITY): Payer: Self-pay | Admitting: *Deleted

## 2016-09-10 ENCOUNTER — Ambulatory Visit (HOSPITAL_COMMUNITY)
Admission: RE | Admit: 2016-09-10 | Discharge: 2016-09-10 | Disposition: A | Discharge status: Home / Self Care | Payer: Medicaid Other | Source: Ambulatory Visit | Attending: Nurse Practitioner | Admitting: Nurse Practitioner

## 2016-09-10 VITALS — BP 111/71 | HR 92 | Wt 149.8 lb

## 2016-09-10 DIAGNOSIS — Z3A16 16 weeks gestation of pregnancy: Secondary | ICD-10-CM | POA: Insufficient documentation

## 2016-09-10 DIAGNOSIS — O09292 Supervision of pregnancy with other poor reproductive or obstetric history, second trimester: Secondary | ICD-10-CM

## 2016-09-10 DIAGNOSIS — O09219 Supervision of pregnancy with history of pre-term labor, unspecified trimester: Secondary | ICD-10-CM

## 2016-09-10 DIAGNOSIS — O09899 Supervision of other high risk pregnancies, unspecified trimester: Secondary | ICD-10-CM

## 2016-09-10 DIAGNOSIS — O09299 Supervision of pregnancy with other poor reproductive or obstetric history, unspecified trimester: Secondary | ICD-10-CM

## 2016-09-11 ENCOUNTER — Ambulatory Visit (HOSPITAL_COMMUNITY)
Admission: AD | Admit: 2016-09-11 | Discharge: 2016-09-11 | Disposition: A | Discharge status: Home / Self Care | Payer: Medicaid Other | Source: Ambulatory Visit | Attending: Obstetrics & Gynecology | Admitting: Obstetrics & Gynecology

## 2016-09-11 ENCOUNTER — Inpatient Hospital Stay (HOSPITAL_COMMUNITY): Payer: Medicaid Other | Admitting: Anesthesiology

## 2016-09-11 ENCOUNTER — Ambulatory Visit (HOSPITAL_COMMUNITY)
Admission: RE | Admit: 2016-09-11 | Payer: Medicaid Other | Source: Ambulatory Visit | Admitting: Obstetrics and Gynecology

## 2016-09-11 ENCOUNTER — Encounter (HOSPITAL_COMMUNITY): Payer: Self-pay | Admitting: Anesthesiology

## 2016-09-11 ENCOUNTER — Encounter (HOSPITAL_COMMUNITY)
Admission: AD | Disposition: A | Discharge status: Home / Self Care | Payer: Self-pay | Source: Ambulatory Visit | Attending: Obstetrics & Gynecology

## 2016-09-11 DIAGNOSIS — Z3A16 16 weeks gestation of pregnancy: Secondary | ICD-10-CM | POA: Diagnosis not present

## 2016-09-11 DIAGNOSIS — O3432 Maternal care for cervical incompetence, second trimester: Secondary | ICD-10-CM | POA: Diagnosis not present

## 2016-09-11 DIAGNOSIS — O0990 Supervision of high risk pregnancy, unspecified, unspecified trimester: Secondary | ICD-10-CM

## 2016-09-11 DIAGNOSIS — Z87891 Personal history of nicotine dependence: Secondary | ICD-10-CM | POA: Diagnosis not present

## 2016-09-11 DIAGNOSIS — N883 Incompetence of cervix uteri: Secondary | ICD-10-CM | POA: Diagnosis present

## 2016-09-11 HISTORY — PX: CERVICAL CERCLAGE: SHX1329

## 2016-09-11 LAB — TYPE AND SCREEN
ABO/RH(D): O POS
ANTIBODY SCREEN: NEGATIVE

## 2016-09-11 LAB — URINE CULTURE: ORGANISM ID, BACTERIA: NO GROWTH

## 2016-09-11 LAB — CBC
HEMATOCRIT: 31.8 % — AB (ref 36.0–46.0)
Hemoglobin: 10.6 g/dL — ABNORMAL LOW (ref 12.0–15.0)
MCH: 27.5 pg (ref 26.0–34.0)
MCHC: 33.3 g/dL (ref 30.0–36.0)
MCV: 82.4 fL (ref 78.0–100.0)
PLATELETS: 187 10*3/uL (ref 150–400)
RBC: 3.86 MIL/uL — ABNORMAL LOW (ref 3.87–5.11)
RDW: 14.2 % (ref 11.5–15.5)
WBC: 5.7 10*3/uL (ref 4.0–10.5)

## 2016-09-11 LAB — ABO/RH: ABO/RH(D): O POS

## 2016-09-11 SURGERY — CERCLAGE, CERVIX, VAGINAL APPROACH
Anesthesia: Spinal | Site: Vagina

## 2016-09-11 MED ORDER — SOD CITRATE-CITRIC ACID 500-334 MG/5ML PO SOLN
30.0000 mL | Freq: Once | ORAL | Status: AC
  Administered 2016-09-11: 30 mL via ORAL
  Filled 2016-09-11: qty 15

## 2016-09-11 MED ORDER — LACTATED RINGERS IV SOLN
INTRAVENOUS | Status: DC
  Administered 2016-09-11 (×2): via INTRAVENOUS

## 2016-09-11 MED ORDER — FENTANYL CITRATE (PF) 100 MCG/2ML IJ SOLN: 25.0000 ug | INTRAMUSCULAR | Status: DC | PRN

## 2016-09-11 MED ORDER — BUPIVACAINE IN DEXTROSE 0.75-8.25 % IT SOLN
INTRATHECAL | Status: DC | PRN
  Administered 2016-09-11: 1 mL via INTRATHECAL

## 2016-09-11 MED ORDER — LACTATED RINGERS IV SOLN
INTRAVENOUS | Status: DC
  Administered 2016-09-11: 09:00:00 via INTRAVENOUS

## 2016-09-11 MED ORDER — OXYCODONE-ACETAMINOPHEN 5-325 MG PO TABS: 1.0000 | ORAL_TABLET | Freq: Four times a day (QID) | ORAL | 0 refills | Status: DC | PRN

## 2016-09-11 MED ORDER — PROMETHAZINE HCL 25 MG/ML IJ SOLN: 6.2500 mg | INTRAMUSCULAR | Status: DC | PRN

## 2016-09-11 SURGICAL SUPPLY — 18 items
CANISTER SUCT 3000ML (MISCELLANEOUS) ×6 IMPLANT
CLOTH BEACON ORANGE TIMEOUT ST (SAFETY) ×3 IMPLANT
COUNTER NEEDLE 1200 MAGNETIC (NEEDLE) ×3 IMPLANT
GLOVE BIO SURGEON STRL SZ 6.5 (GLOVE) ×2 IMPLANT
GLOVE BIO SURGEONS STRL SZ 6.5 (GLOVE) ×1
GLOVE BIOGEL PI IND STRL 7.0 (GLOVE) ×2 IMPLANT
GLOVE BIOGEL PI INDICATOR 7.0 (GLOVE) ×4
GOWN STRL REUS W/TWL LRG LVL3 (GOWN DISPOSABLE) ×6 IMPLANT
NS IRRIG 1000ML POUR BTL (IV SOLUTION) ×3 IMPLANT
PACK VAGINAL MINOR WOMEN LF (CUSTOM PROCEDURE TRAY) ×3 IMPLANT
PAD OB MATERNITY 4.3X12.25 (PERSONAL CARE ITEMS) ×3 IMPLANT
PAD PREP 24X48 CUFFED NSTRL (MISCELLANEOUS) ×3 IMPLANT
SUT PROLENE 1 CT 1 30 (SUTURE) ×3 IMPLANT
TOWEL OR 17X24 6PK STRL BLUE (TOWEL DISPOSABLE) ×6 IMPLANT
TRAY FOLEY CATH SILVER 14FR (SET/KITS/TRAYS/PACK) ×3 IMPLANT
TUBING NON-CON 1/4 X 20 CONN (TUBING) ×2 IMPLANT
TUBING NON-CON 1/4 X 20' CONN (TUBING) ×1
YANKAUER SUCT BULB TIP NO VENT (SUCTIONS) ×3 IMPLANT

## 2016-09-11 NOTE — Anesthesia Preprocedure Evaluation (Signed)
Anesthesia Evaluation  Patient identified by MRN, date of birth, ID band Patient awake    Reviewed: Allergy & Precautions, H&P , NPO status , Patient's Chart, lab work & pertinent test results  Airway Mallampati: I  TM Distance: >3 FB Neck ROM: full    Dental no notable dental hx.    Pulmonary neg pulmonary ROS, former smoker,    Pulmonary exam normal        Cardiovascular negative cardio ROS Normal cardiovascular exam     Neuro/Psych negative neurological ROS  negative psych ROS   GI/Hepatic negative GI ROS, Neg liver ROS,   Endo/Other  negative endocrine ROS  Renal/GU negative Renal ROS     Musculoskeletal negative musculoskeletal ROS (+)   Abdominal Normal abdominal exam  (+)   Peds  Hematology negative hematology ROS (+)   Anesthesia Other Findings   Reproductive/Obstetrics (+) Pregnancy                             Anesthesia Physical Anesthesia Plan  ASA: II  Anesthesia Plan: Spinal   Post-op Pain Management:    Induction:   Airway Management Planned:   Additional Equipment:   Intra-op Plan:   Post-operative Plan:   Informed Consent: I have reviewed the patients History and Physical, chart, labs and discussed the procedure including the risks, benefits and alternatives for the proposed anesthesia with the patient or authorized representative who has indicated his/her understanding and acceptance.     Plan Discussed with: CRNA and Surgeon  Anesthesia Plan Comments:         Anesthesia Quick Evaluation

## 2016-09-11 NOTE — Anesthesia Procedure Notes (Signed)
Spinal  Patient location during procedure: OR Start time: 09/11/2016 1:37 PM End time: 09/11/2016 1:40 PM Staffing Anesthesiologist: Leilani AbleHATCHETT, Devan Babino Performed: anesthesiologist  Preanesthetic Checklist Completed: patient identified, surgical consent, pre-op evaluation, timeout performed, IV checked, risks and benefits discussed and monitors and equipment checked Spinal Block Patient position: sitting Prep: site prepped and draped and DuraPrep Patient monitoring: heart rate, cardiac monitor, continuous pulse ox and blood pressure Approach: midline Location: L3-4 Injection technique: single-shot Needle Needle type: Sprotte  Needle gauge: 24 G Needle length: 9 cm Assessment Sensory level: T10

## 2016-09-11 NOTE — OR Nursing (Signed)
15:50- pt dc from monitors to attempt to progress to phase 2. Margarita Mailoni Hiran Leard rn

## 2016-09-11 NOTE — Transfer of Care (Signed)
Immediate Anesthesia Transfer of Care Note  Patient: Tamara Cowan  Procedure(s) Performed: Procedure(s): CERCLAGE CERVICAL (N/A)  Patient Location: PACU  Anesthesia Type:Spinal  Level of Consciousness: awake and alert   Airway & Oxygen Therapy: Patient Spontanous Breathing  Post-op Assessment: Report given to RN and Post -op Vital signs reviewed and stable  Post vital signs: Reviewed  Last Vitals:  Vitals:   09/11/16 0848  BP: 113/67  Pulse: 82  Resp: 16  Temp: 36.4 C    Last Pain: There were no vitals filed for this visit.       Complications: No apparent anesthesia complications

## 2016-09-11 NOTE — Anesthesia Postprocedure Evaluation (Addendum)
Anesthesia Post Note  Patient: [name]  Procedure(s) Performed: Procedure(s) (LRB): CERCLAGE CERVICAL (N/A)  Patient location during evaluation: PACU Anesthesia Type: Spinal Level of consciousness: awake Pain management: pain level controlled Vital Signs Assessment: post-procedure vital signs reviewed and stable Respiratory status: spontaneous breathing Cardiovascular status: stable Postop Assessment: no headache, no backache, spinal receding, patient able to bend at knees and no signs of nausea or vomiting Anesthetic complications: no        Last Vitals:  Vitals:   09/11/16 1445 09/11/16 1500  BP: 108/69 113/70  Pulse: 95 87  Resp: 12 14  Temp:      Last Pain: There were no vitals filed for this visit. Pain Goal:                 Shonica Weier JR,JOHN [place] Condon

## 2016-09-11 NOTE — MAU Note (Signed)
Patient presents to mau to be prepped for scheduled Cerclage procedure today. Denies any pain at this time.

## 2016-09-11 NOTE — Op Note (Signed)
Surgeon: Scheryl DarterARNOLD,Zamariah Seaborn   Assistants: None. Anesthesia: Spinal  ASA Class: 1 Procedure: Cervical cerclage Preop diagnosis: Incompetent cervix at [redacted] weeks gestation Postop diagnosis: Same Estimated blood loss: Less than 5 mL Complications: None Drains: None Counts: Correct  The patient gave written consent for cervical cerclage due to incompetent cervix at [redacted] weeks gestation. Patient identification was confirmed and she was brought to the OR and spinal anesthesia was induced. She was placed in dorsal lithotomy position. Her perineum and vagina versus sterilely prepped and draped. The bladder was drained with a Foley catheter. Exam revealed a long closed cervix. A weighted speculum was placed. The cervix was visualized with Deaver retractors. . #1 Prolene was used with suture placement at 12-3, 3-6, 6-9, 9-12. Good placement was seen with a suture tied 6-8 times at 12 o'clock about 3 cm back from the end of the cervix. All instruments were removed. There was minimal bleeding.. Patient tolerated the procedure well without complications. She was brought in stable condition to the PACU.  Scheryl DarterJames Gabrielle Wakeland 09/11/2016 2:12 PM

## 2016-09-11 NOTE — H&P (Signed)
Subjective:  Tamara Cowan is a 26 y.o. G3P0110 at 4215w6d being seen today for ongoing prenatal care.  She is currently monitored for the following issues for this high-risk pregnancy and has Incompetent cervix; Supervision of high-risk pregnancy; and ASCUS with positive high risk HPV cervical on her problem list. She presents for cerclage due to short cervix seen on [place] 09/10/16 Patient reports mild, irregular low belly discomfort.   . Vag. Bleeding: None.  Movement: Absent. Denies leaking of fluid.   The following portions of the patient's history were reviewed and updated as appropriate: allergies, current medications, past family history, past medical history, past social history, past surgical history and problem list. Problem list updated.  Past Medical History:  Diagnosis Date  . Preterm labor   . UTI (urinary tract infection) 2010   during pregnancy   Past Surgical History:  Procedure Laterality Date  . NO PAST SURGERIES      Objective:      Vitals:   09/09/16 1247  BP: 101/63  Pulse: 84  Weight: 149 lb 6.4 oz (67.8 kg)   Blood pressure 113/67, pulse 82, temperature 97.5 F (36.4 C), resp. rate 16, height 5\' 7"  (1.702 m), weight 152 lb 0.6 oz (69 kg), last menstrual period 04/09/2016, SpO2 100 %.  Fetal Status: Fetal Heart Rate (bpm): 160   Movement: Absent     General:  Alert, oriented and cooperative. Patient is in no acute distress.  Skin: Skin is warm and dry. No rash noted.   Cardiovascular: Normal heart rate noted  Respiratory: Normal respiratory effort, no problems with respiration noted  Abdomen: Soft, gravid, appropriate for gestational age. Pain/Pressure: Present     Pelvic:  closed/?50%/high  Extremities: Normal range of motion.  Edema: None  Mental Status: Normal mood and affect. Normal behavior. Normal judgment and thought content.      Assessment and Plan:  Pregnancy: G3P0110 at 7266w1d   1. Supervision of high risk pregnancy in second  trimester Routine care. UCx for low belly discomfort. Pt amenable to quad screen today.  - Urine Culture - AFP, Quad Screen  2. Incompetent cervix History reviewed with patient and she states that it was 2010 and at approx 470m and that she had spotting (no pain) so went to the hospital and she was diagnosed based on an u/s. She states that she doesn't believe she was offered any interventions and then subsequently had PTB. Had anatomy u/s at 15wks on 2/23 and had TAUS CL of 2.7cm with mfm; no e/o funneling. They had her sign release for records and plans for f/u CL, which is tomorrow. [facility] EP[facility] or able to find in care everywhere. D/w her that plan of care is really determined by u/s tomorrow. If e/o cx shortening, then could offer vag progesterone and/or rescue cerclage. If normal CL, then could offer ppx cerclage and/or 17p. Pt told to be NPO p MN in case u/s findings show possibility of surgical interventiion tomorrow.  Pt amenable to plan. Cerclage scheduled for today after US showed funneling and 1.5 cm cx length Preterm labor symptoms and general obstetric precautions including but not limited to vaginal bleeding, contractions, leaking of fluid and fetal movement were reviewed in detail with the patient.   Patient desires surgical management with cerclage.  The risks of surgery were discussed in detail with the patient including but not limited BJ:[id number]:ruptured membranes bleeding which may require transfusion or reoperation; infection which may require prolonged hospitalization or re-hospitalization and  antibiotic therapy; injury to bowel, bladder, ureters and major vessels or other surrounding organs; need for additional procedures including laparotomy; thromboembolic phenomenon, incisional problems and other postoperative or anesthesia complications.  Patient was told that the likelihood that her condition and symptoms will be treated effectively with this surgical management was  very high; the postoperative expectations were also discussed in detail. The patient also understands the alternative treatment options which were discussed in full. All questions were answered.

## 2016-09-11 NOTE — Discharge Instructions (Signed)
[facility] - Preparing for Surgery ° °Before surgery, you can play an important role.  Because skin is not sterile, your skin needs to be as free of germs as possible.  You can reduce the number of germs on you skin by washing with CHG (chlorahexidine gluconate) soap before surgery.  CHG is an antiseptic cleaner which kills germs and bonds with the skin to continue killing germs even after washing. ° °Please DO NOT use if you have an allergy to CHG or antibacterial soaps.  If your skin becomes reddened/irritated stop using the CHG and inform your nurse when you arrive at Short Stay. ° °Do not shave (including legs and underarms) for at least 48 hours prior to the first CHG shower.  You may shave your face. ° °Please follow these instructions carefully: ° ° 1.  Shower with CHG Soap the night before surgery and the                                morning of Surgery. ° 2.  If you choose to wash your hair, wash your hair first as usual with your       normal shampoo. ° 3.  After you shampoo, rinse your hair and body thoroughly to remove the                      Shampoo. ° 4.  Use CHG as you would any other liquid soap.  You can apply chg directly       to the skin and wash gently with scrungie or a clean washcloth. ° 5.  Apply the CHG Soap to your body ONLY FROM THE NECK DOWN.        Do not use on open wounds or open sores.  Avoid contact with your eyes,       ears, mouth and genitals (private parts).  Wash genitals (private parts)       with your normal soap. ° 6.  Wash thoroughly, paying special attention to the area where your surgery        will be performed. ° 7.  Thoroughly rinse your body with warm water from the neck down. ° 8.  DO NOT shower/wash with your normal soap after using and rinsing off       the CHG Soap. ° 9.  Pat yourself dry with a clean towel. °           10.  Wear clean pajamas. °           11.  Place clean sheets on your bed the night of your first shower and do not        sleep with  pets. ° °Day of Surgery ° °Do not apply any lotions/deoderants the morning of surgery.  Please wear clean clothes to the hospital/surgery center. ° °Cervical Cerclage, Care After °This sheet gives you information about how to care for yourself after your procedure. Your health care provider may also give you more specific instructions. If you have problems or questions, contact your health care provider. °What can I expect after the procedure? °After your procedure, it is common to have: °· Cramping in your abdomen. °· Mucus discharge for several days. °· Painful urination (dysuria). °· Small drops of blood coming from your vagina (spotting). °Follow these instructions at home: °· Follow instructions from your health care provider about bed rest, if this applies.   You may need to be on bed rest for up to 3 days.  Take over-the-counter and prescription medicines only as told by your health care provider.  Do not drive or use heavy machinery while taking prescription pain medicine.  Keep track of your vaginal discharge and watch for any changes. If you notice changes, tell your health care provider.  Avoid physical activities and exercise until your health care provider approves. Ask your health care provider what activities are safe for you.  Until your health care provider approves:  Do not douche.  Do not have sexual intercourse.  Keep all pre-birth (prenatal) visits and all follow-up visits as told by your health care provider. This is important. You will probably have weekly visits to have your cervix checked, and you may need an ultrasound. Contact a health care provider if:  You have abnormal or bad-smelling vaginal discharge, such as clots.  You develop a rash on your skin. This may look like redness and swelling.  You become light-headed or feel like you are going to faint.  You have abdominal pain that does not get better with medicine.  You have persistent nausea or vomiting. Get  help right away if:  You have vaginal bleeding that is heavier or more frequent than spotting.  You are leaking fluid or have a gush of fluid from your vagina (your water breaks).  You have a fever or chills.  You faint.  You have uterine contractions. These may feel like:  A back ache.  Lower abdominal pain.  Mild cramps, similar to menstrual cramps.  Tightening or pressure in your abdomen.  You think that your baby is not moving as much as usual, or you cannot feel your baby move.  You have chest pain.  You have shortness of breath. This information is not intended to replace advice given to you by your health care provider. Make sure you discuss any questions you have with your health care provider. Document Released: 04/11/2013 Document Revised: 02/18/2016 Document Reviewed: 01/23/2016 Elsevier Interactive Patient Education  2017 [employer]

## 2016-09-13 ENCOUNTER — Encounter (HOSPITAL_COMMUNITY): Payer: Self-pay | Admitting: Obstetrics & Gynecology

## 2016-09-16 ENCOUNTER — Ambulatory Visit (INDEPENDENT_AMBULATORY_CARE_PROVIDER_SITE_OTHER): Payer: Medicaid Other | Admitting: Obstetrics and Gynecology

## 2016-09-16 VITALS — BP 103/65 | HR 98 | Wt 155.6 lb

## 2016-09-16 DIAGNOSIS — O3432 Maternal care for cervical incompetence, second trimester: Secondary | ICD-10-CM

## 2016-09-16 DIAGNOSIS — O0992 Supervision of high risk pregnancy, unspecified, second trimester: Secondary | ICD-10-CM

## 2016-09-16 DIAGNOSIS — O98812 Other maternal infectious and parasitic diseases complicating pregnancy, second trimester: Secondary | ICD-10-CM | POA: Diagnosis not present

## 2016-09-16 DIAGNOSIS — B373 Candidiasis of vulva and vagina: Secondary | ICD-10-CM

## 2016-09-16 DIAGNOSIS — N883 Incompetence of cervix uteri: Secondary | ICD-10-CM

## 2016-09-16 DIAGNOSIS — B3731 Acute candidiasis of vulva and vagina: Secondary | ICD-10-CM

## 2016-09-16 LAB — AFP, QUAD SCREEN
DIA Mom Value: 1.02
DIA Value (EIA): 186.99 pg/mL
DSR (BY AGE) 1 IN: 842
DSR (SECOND TRIMESTER) 1 IN: 10000
Gestational Age: 15.9 WEEKS
MSAFP MOM: 1.74
MSAFP: 62 ng/mL
MSHCG Mom: 1.09
MSHCG: 44733 m[IU]/mL
Maternal Age At EDD: 28.2 YEARS
Osb Risk: 2954
TEST RESULTS AFP: NEGATIVE
Weight: 149 [lb_av]
uE3 Mom: 1.2
uE3 Value: 0.95 ng/mL

## 2016-09-16 MED ORDER — MICONAZOLE NITRATE 2 % VA CREA: 1.0000 | TOPICAL_CREAM | Freq: Every day | VAGINAL | 2 refills | Status: DC

## 2016-09-16 NOTE — Progress Notes (Signed)
Prenatal Visit Note Date: 09/16/2016 Clinic: [facility]  Subjective:  Tamara Cowan is a 26 y.o. G3P0110 at 4068w6d being seen today for ongoing prenatal care.  She is currently monitored for the following issues for this high-risk pregnancy and has Incompetent cervix; Supervision of high-risk pregnancy; and ASCUS with positive high risk HPV cervical on her problem list.  Patient reports with occasional spotting (old dried blood) Contractions: Not present. Vag. Bleeding: Small.  Movement: Present. Denies leaking of fluid.   The following portions of the patient's history were reviewed and updated as appropriate: allergies, current medications, past family history, past medical history, past social history, past surgical history and problem list. Problem list updated.  Objective:   Vitals:   09/16/16 1601  BP: 103/65  Pulse: 98  Weight: 155 lb 9.6 oz (70.6 kg)    Fetal Status: Fetal Heart Rate (bpm): 165   Movement: Present     General:  Alert, oriented and cooperative. Patient is in no acute distress.  Skin: Skin is warm and dry. No rash noted.   Cardiovascular: Normal heart rate noted  Respiratory: Normal respiratory effort, no problems with respiration noted  Abdomen: Soft, gravid, appropriate for gestational age. Pain/Pressure: Present     Pelvic:  EGBUS normal Vaginal vault with copious amounts of white cottage cheese like d/c Cervix visually closed and looks long and stitch in place at 12 o'clock. No blood in vault  Extremities: Normal range of motion.  Edema: None  Mental Status: Normal mood and affect. Normal behavior. Normal judgment and thought content.   Urinalysis:      Assessment and Plan:  Pregnancy: G3P0110 at 3468w6d  1. Vulvovaginal candidiasis Monistat 7  2. Supervision of high risk pregnancy in second trimester Routine care. Needs anatomy scan if not done next week  3. Incompetent cervix f/u CL next week  Preterm labor symptoms and  general obstetric precautions including but not limited to vaginal bleeding, contractions, leaking of fluid and fetal movement were reviewed in detail with the patient. Please refer to After Visit Summary for other counseling recommendations.  Return in about 2 weeks (around 09/30/2016).   Kindred Bingharlie Demitria Hay, MD

## 2016-09-21 ENCOUNTER — Telehealth (HOSPITAL_COMMUNITY): Payer: Self-pay | Admitting: Maternal and Fetal Medicine

## 2016-09-24 ENCOUNTER — Other Ambulatory Visit (HOSPITAL_COMMUNITY): Payer: Self-pay | Admitting: Maternal and Fetal Medicine

## 2016-09-24 ENCOUNTER — Ambulatory Visit (HOSPITAL_COMMUNITY)
Admission: RE | Admit: 2016-09-24 | Discharge: 2016-09-24 | Disposition: A | Discharge status: Home / Self Care | Payer: Medicaid Other | Source: Ambulatory Visit | Attending: Nurse Practitioner | Admitting: Nurse Practitioner

## 2016-09-24 ENCOUNTER — Ambulatory Visit (HOSPITAL_COMMUNITY): Payer: Medicaid Other

## 2016-09-24 ENCOUNTER — Encounter (HOSPITAL_COMMUNITY): Payer: Self-pay

## 2016-09-24 DIAGNOSIS — Z3682 Encounter for antenatal screening for nuchal translucency: Secondary | ICD-10-CM | POA: Insufficient documentation

## 2016-09-24 DIAGNOSIS — O0992 Supervision of high risk pregnancy, unspecified, second trimester: Secondary | ICD-10-CM

## 2016-09-24 DIAGNOSIS — O3432 Maternal care for cervical incompetence, second trimester: Secondary | ICD-10-CM | POA: Diagnosis not present

## 2016-09-24 DIAGNOSIS — Z3A18 18 weeks gestation of pregnancy: Secondary | ICD-10-CM | POA: Diagnosis not present

## 2016-09-24 DIAGNOSIS — O09292 Supervision of pregnancy with other poor reproductive or obstetric history, second trimester: Secondary | ICD-10-CM

## 2016-09-24 DIAGNOSIS — Z3686 Encounter for antenatal screening for cervical length: Secondary | ICD-10-CM | POA: Diagnosis not present

## 2016-09-24 DIAGNOSIS — R87810 Cervical high risk human papillomavirus (HPV) DNA test positive: Secondary | ICD-10-CM

## 2016-09-24 DIAGNOSIS — N883 Incompetence of cervix uteri: Secondary | ICD-10-CM

## 2016-09-24 DIAGNOSIS — R87610 Atypical squamous cells of undetermined significance on cytologic smear of cervix (ASC-US): Secondary | ICD-10-CM

## 2016-10-04 ENCOUNTER — Encounter: Payer: Medicaid Other | Admitting: Obstetrics & Gynecology

## 2016-10-08 ENCOUNTER — Encounter (HOSPITAL_COMMUNITY): Payer: Self-pay

## 2016-10-08 ENCOUNTER — Ambulatory Visit (HOSPITAL_COMMUNITY)
Admission: RE | Admit: 2016-10-08 | Discharge: 2016-10-08 | Disposition: A | Discharge status: Home / Self Care | Payer: Medicaid Other | Source: Ambulatory Visit | Attending: Obstetrics & Gynecology | Admitting: Obstetrics & Gynecology

## 2016-10-08 DIAGNOSIS — R87610 Atypical squamous cells of undetermined significance on cytologic smear of cervix (ASC-US): Secondary | ICD-10-CM

## 2016-10-08 DIAGNOSIS — R87810 Cervical high risk human papillomavirus (HPV) DNA test positive: Secondary | ICD-10-CM

## 2016-10-08 DIAGNOSIS — O3432 Maternal care for cervical incompetence, second trimester: Secondary | ICD-10-CM | POA: Insufficient documentation

## 2016-10-08 DIAGNOSIS — Z3A20 20 weeks gestation of pregnancy: Secondary | ICD-10-CM | POA: Insufficient documentation

## 2016-10-08 DIAGNOSIS — O0992 Supervision of high risk pregnancy, unspecified, second trimester: Secondary | ICD-10-CM

## 2016-10-08 DIAGNOSIS — N883 Incompetence of cervix uteri: Secondary | ICD-10-CM

## 2016-10-08 DIAGNOSIS — O09299 Supervision of pregnancy with other poor reproductive or obstetric history, unspecified trimester: Secondary | ICD-10-CM | POA: Insufficient documentation

## 2016-10-11 ENCOUNTER — Other Ambulatory Visit (HOSPITAL_COMMUNITY): Payer: Self-pay | Admitting: *Deleted

## 2016-10-11 DIAGNOSIS — O09292 Supervision of pregnancy with other poor reproductive or obstetric history, second trimester: Secondary | ICD-10-CM

## 2016-10-12 ENCOUNTER — Encounter: Payer: Medicaid Other | Admitting: Obstetrics and Gynecology

## 2016-10-12 ENCOUNTER — Encounter: Payer: Self-pay | Admitting: Obstetrics and Gynecology

## 2016-10-12 ENCOUNTER — Telehealth: Payer: Self-pay | Admitting: Obstetrics and Gynecology

## 2016-10-12 NOTE — Progress Notes (Signed)
Patient did not keep OB appointment for 10/12/2016.  Lisle Skillman, Jr MD Attending [facility]   

## 2016-10-12 NOTE — Telephone Encounter (Signed)
Called patient due to missed ob appointment. Left message informing patient that she had missed her appointment, but that we were able to reschedule for 4/11 @ 2:20 and to call us back if she had any questions or concerns.

## 2016-10-13 ENCOUNTER — Encounter: Payer: Medicaid Other | Admitting: Obstetrics and Gynecology

## 2016-10-13 ENCOUNTER — Encounter: Payer: Self-pay | Admitting: Obstetrics and Gynecology

## 2016-10-13 NOTE — Progress Notes (Signed)
Patient did not keep OB appointment for 10/13/2016.  Korey Prashad, Jr MD Attending [facility]   

## 2016-10-15 ENCOUNTER — Encounter: Payer: Medicaid Other | Admitting: Obstetrics and Gynecology

## 2016-10-20 ENCOUNTER — Ambulatory Visit (INDEPENDENT_AMBULATORY_CARE_PROVIDER_SITE_OTHER): Payer: Medicaid Other | Admitting: Obstetrics and Gynecology

## 2016-10-20 VITALS — BP 128/84 | HR 93

## 2016-10-20 DIAGNOSIS — O0992 Supervision of high risk pregnancy, unspecified, second trimester: Secondary | ICD-10-CM

## 2016-10-20 DIAGNOSIS — O3432 Maternal care for cervical incompetence, second trimester: Secondary | ICD-10-CM

## 2016-10-20 DIAGNOSIS — N883 Incompetence of cervix uteri: Secondary | ICD-10-CM

## 2016-10-20 LAB — POCT URINALYSIS DIP (DEVICE)
BILIRUBIN URINE: NEGATIVE
GLUCOSE, UA: NEGATIVE mg/dL
HGB URINE DIPSTICK: NEGATIVE
Ketones, ur: NEGATIVE mg/dL
NITRITE: NEGATIVE
Protein, ur: 30 mg/dL — AB
Specific Gravity, Urine: 1.02 (ref 1.005–1.030)
Urobilinogen, UA: 0.2 mg/dL (ref 0.0–1.0)
pH: 8.5 — ABNORMAL HIGH (ref 5.0–8.0)

## 2016-10-21 ENCOUNTER — Encounter (HOSPITAL_COMMUNITY): Payer: Self-pay

## 2016-10-21 NOTE — Progress Notes (Signed)
Prenatal Visit Note Date: 10/20/2016 Clinic: [facility]  Subjective:  Tamara Cowan is a 26 y.o. G3P0110 at [redacted]w[redacted]d being seen today for ongoing prenatal care.  She is currently monitored for the following issues for this high-risk pregnancy and has Incompetent cervix; Supervision of high-risk pregnancy; ASCUS with positive high risk HPV cervical; and Cervical cerclage suture present in second trimester on her problem list.  Patient reports no complaints.   Contractions: Not present.  .  Movement: Present. Denies leaking of fluid.   The following portions of the patient's history were reviewed and updated as appropriate: allergies, current medications, past family history, past medical history, past social history, past surgical history and problem list. Problem list updated.  Objective:   Vitals:   10/20/16 1353  BP: 128/84  Pulse: 93    Fetal Status: Fetal Heart Rate (bpm): 154   Movement: Present     General:  Alert, oriented and cooperative. Patient is in no acute distress.  Skin: Skin is warm and dry. No rash noted.   Cardiovascular: Normal heart rate noted  Respiratory: Normal respiratory effort, no problems with respiration noted  Abdomen: Soft, gravid, appropriate for gestational age. Pain/Pressure: Present     Pelvic:  Cervical exam deferred        Extremities: Normal range of motion.  Edema: None  Mental Status: Normal mood and affect. Normal behavior. Normal judgment and thought content.   Urinalysis:      Assessment and Plan:  Pregnancy: G3P0110 at [redacted]w[redacted]d  1. Cervical cerclage suture present in second trimester Remove at 36-37wks. Not on 17p b/c started having cx shortening already  2. Incompetent cervix Stable CL on 4/6 (1.7-2.1) has rpt on Friday. Pelvic rest d/w pt  3. Supervision of high risk pregnancy in second trimester ?dysuria. ucx sent  Preterm labor symptoms and general obstetric precautions including but not limited to vaginal  bleeding, contractions, leaking of fluid and fetal movement were reviewed in detail with the patient. Please refer to After Visit Summary for other counseling recommendations.  Return in about 2 weeks (around 11/03/2016).   [place] Bing, MD

## 2016-10-22 ENCOUNTER — Ambulatory Visit (HOSPITAL_COMMUNITY)
Admission: RE | Admit: 2016-10-22 | Discharge: 2016-10-22 | Disposition: A | Discharge status: Home / Self Care | Payer: Medicaid Other | Source: Ambulatory Visit | Attending: Obstetrics & Gynecology | Admitting: Obstetrics & Gynecology

## 2016-10-22 ENCOUNTER — Encounter (HOSPITAL_COMMUNITY): Payer: Self-pay

## 2016-10-22 LAB — URINE CULTURE

## 2016-11-01 ENCOUNTER — Telehealth: Payer: Self-pay | Admitting: *Deleted

## 2016-11-01 NOTE — Telephone Encounter (Signed)
Returned patient call. Patient states she has a head cold, stuffy nose and slight headache. Has tried tylenol a couple of times yesterday but it didn't help. No fever, no visual disturbance or abd pain. Advised patient to try mucinex to help loosen secretions and drink lots of water to help with this. She can use saline spray as well. Continue tylenol 2 tabs q4h as needed. If symptoms get worse or fail to improve call back. She has an appointment in the clinic 5/2. Understanding voiced.

## 2016-11-01 NOTE — Telephone Encounter (Signed)
Patient left message on nurse voicemail on 11/01/16 at [phone].  States she has a head cold and tylenol isn't working.  Is requesting what else she can take that might help.  Requests a return call to [phone].

## 2016-11-03 ENCOUNTER — Encounter: Payer: Self-pay | Admitting: Obstetrics and Gynecology

## 2016-11-03 ENCOUNTER — Encounter: Payer: Medicaid Other | Admitting: Obstetrics and Gynecology

## 2016-11-03 NOTE — Progress Notes (Signed)
Patient did not keep OB appointment for 11/03/2016.  Dawson Albers, Jr MD Attending [facility]   

## 2016-11-04 ENCOUNTER — Encounter: Payer: Self-pay | Admitting: General Practice

## 2016-11-04 ENCOUNTER — Telehealth: Payer: Self-pay | Admitting: General Practice

## 2016-11-05 ENCOUNTER — Ambulatory Visit (HOSPITAL_COMMUNITY): Payer: Medicaid Other

## 2016-11-15 ENCOUNTER — Encounter (HOSPITAL_COMMUNITY): Payer: Self-pay | Admitting: Obstetrics and Gynecology

## 2016-11-15 ENCOUNTER — Inpatient Hospital Stay (HOSPITAL_COMMUNITY)
Admission: AD | Admit: 2016-11-15 | Discharge: 2016-11-20 | DRG: 765 | Disposition: A | Discharge status: Home / Self Care | Payer: Medicaid Other | Source: Ambulatory Visit | Attending: Obstetrics and Gynecology | Admitting: Obstetrics and Gynecology

## 2016-11-15 ENCOUNTER — Inpatient Hospital Stay (HOSPITAL_COMMUNITY): Payer: Medicaid Other

## 2016-11-15 DIAGNOSIS — Z98891 History of uterine scar from previous surgery: Secondary | ICD-10-CM

## 2016-11-15 DIAGNOSIS — O321XX0 Maternal care for breech presentation, not applicable or unspecified: Secondary | ICD-10-CM | POA: Diagnosis not present

## 2016-11-15 DIAGNOSIS — O328XX0 Maternal care for other malpresentation of fetus, not applicable or unspecified: Secondary | ICD-10-CM | POA: Diagnosis present

## 2016-11-15 DIAGNOSIS — O4702 False labor before 37 completed weeks of gestation, second trimester: Secondary | ICD-10-CM | POA: Diagnosis present

## 2016-11-15 DIAGNOSIS — O09292 Supervision of pregnancy with other poor reproductive or obstetric history, second trimester: Secondary | ICD-10-CM

## 2016-11-15 DIAGNOSIS — O3432 Maternal care for cervical incompetence, second trimester: Secondary | ICD-10-CM | POA: Diagnosis present

## 2016-11-15 DIAGNOSIS — Z87891 Personal history of nicotine dependence: Secondary | ICD-10-CM | POA: Diagnosis not present

## 2016-11-15 DIAGNOSIS — Z3A25 25 weeks gestation of pregnancy: Secondary | ICD-10-CM

## 2016-11-15 DIAGNOSIS — O0932 Supervision of pregnancy with insufficient antenatal care, second trimester: Secondary | ICD-10-CM

## 2016-11-15 DIAGNOSIS — O4692 Antepartum hemorrhage, unspecified, second trimester: Secondary | ICD-10-CM

## 2016-11-15 LAB — CBC
HCT: 33.1 % — ABNORMAL LOW (ref 36.0–46.0)
Hemoglobin: 10.9 g/dL — ABNORMAL LOW (ref 12.0–15.0)
MCH: 28.2 pg (ref 26.0–34.0)
MCHC: 32.9 g/dL (ref 30.0–36.0)
MCV: 85.8 fL (ref 78.0–100.0)
PLATELETS: 193 10*3/uL (ref 150–400)
RBC: 3.86 MIL/uL — AB (ref 3.87–5.11)
RDW: 14.1 % (ref 11.5–15.5)
WBC: 6.4 10*3/uL (ref 4.0–10.5)

## 2016-11-15 LAB — URINALYSIS, MICROSCOPIC (REFLEX): RBC / HPF: NONE SEEN RBC/hpf (ref 0–5)

## 2016-11-15 LAB — TYPE AND SCREEN
ABO/RH(D): O POS
ANTIBODY SCREEN: NEGATIVE

## 2016-11-15 LAB — URINALYSIS, ROUTINE W REFLEX MICROSCOPIC
Bilirubin Urine: NEGATIVE
GLUCOSE, UA: NEGATIVE mg/dL
HGB URINE DIPSTICK: NEGATIVE
KETONES UR: NEGATIVE mg/dL
Nitrite: NEGATIVE
PROTEIN: NEGATIVE mg/dL
Specific Gravity, Urine: 1.02 (ref 1.005–1.030)
pH: 7 (ref 5.0–8.0)

## 2016-11-15 MED ORDER — CALCIUM CARBONATE ANTACID 500 MG PO CHEW: 2.0000 | CHEWABLE_TABLET | ORAL | Status: DC | PRN

## 2016-11-15 MED ORDER — PHENAZOPYRIDINE HCL 200 MG PO TABS
200.0000 mg | ORAL_TABLET | Freq: Three times a day (TID) | ORAL | Status: DC
  Administered 2016-11-15 – 2016-11-17 (×5): 200 mg via ORAL
  Filled 2016-11-15 (×9): qty 1

## 2016-11-15 MED ORDER — PROGESTERONE MICRONIZED 200 MG PO CAPS
200.0000 mg | ORAL_CAPSULE | Freq: Every day | ORAL | Status: DC
  Administered 2016-11-15 – 2016-11-16 (×2): 200 mg via VAGINAL
  Filled 2016-11-15 (×2): qty 1

## 2016-11-15 MED ORDER — SODIUM CHLORIDE 0.9 % IV SOLN
2.0000 g | Freq: Four times a day (QID) | INTRAVENOUS | Status: DC
  Administered 2016-11-15 – 2016-11-17 (×7): 2 g via INTRAVENOUS
  Filled 2016-11-15 (×8): qty 2000

## 2016-11-15 MED ORDER — DEXTROSE 5 % IV SOLN
500.0000 mg | Freq: Once | INTRAVENOUS | Status: AC
  Administered 2016-11-15: 500 mg via INTRAVENOUS
  Filled 2016-11-15: qty 500

## 2016-11-15 MED ORDER — ZOLPIDEM TARTRATE 5 MG PO TABS: 5.0000 mg | ORAL_TABLET | Freq: Every evening | ORAL | Status: DC | PRN

## 2016-11-15 MED ORDER — DOCUSATE SODIUM 100 MG PO CAPS
100.0000 mg | ORAL_CAPSULE | Freq: Every day | ORAL | Status: DC
  Administered 2016-11-15 – 2016-11-16 (×2): 100 mg via ORAL
  Filled 2016-11-15 (×2): qty 1

## 2016-11-15 MED ORDER — LACTATED RINGERS IV SOLN
INTRAVENOUS | Status: DC
  Administered 2016-11-15 – 2016-11-17 (×6): via INTRAVENOUS

## 2016-11-15 MED ORDER — ACETAMINOPHEN 325 MG PO TABS
650.0000 mg | ORAL_TABLET | ORAL | Status: DC | PRN
  Administered 2016-11-15 – 2016-11-17 (×2): 650 mg via ORAL
  Filled 2016-11-15 (×2): qty 2

## 2016-11-15 MED ORDER — BETAMETHASONE SOD PHOS & ACET 6 (3-3) MG/ML IJ SUSP
12.0000 mg | Freq: Once | INTRAMUSCULAR | Status: AC
  Administered 2016-11-15: 12 mg via INTRAMUSCULAR
  Filled 2016-11-15: qty 2

## 2016-11-15 MED ORDER — PRENATAL MULTIVITAMIN CH
1.0000 | ORAL_TABLET | Freq: Every day | ORAL | Status: DC
  Administered 2016-11-16: 1 via ORAL
  Filled 2016-11-15: qty 1

## 2016-11-15 MED ORDER — BETAMETHASONE SOD PHOS & ACET 6 (3-3) MG/ML IJ SUSP
12.0000 mg | Freq: Once | INTRAMUSCULAR | Status: AC
  Administered 2016-11-16: 12 mg via INTRAMUSCULAR
  Filled 2016-11-15: qty 2

## 2016-11-15 MED ORDER — AMOXICILLIN 500 MG PO CAPS: 500.0000 mg | ORAL_CAPSULE | Freq: Three times a day (TID) | ORAL | Status: DC

## 2016-11-15 NOTE — MAU Provider Note (Signed)
History     CSN: [id number]  Arrival date and time: 11/15/16 1213   First Provider Initiated Contact with Patient 11/15/16 1436      No chief complaint on file.  HPI   Ms.Tamara Cowan is a 26 y.o. female 503P0110 @ 549w3d presenting to MAU with vaginal bleeding. The patient has a history of incompetent cervix with a therapeutic cerclage in place with this pregnancy. Cerclage was placed 3/10.  Yesterday she had intercourse and noted a piece of the cerclage on her significant others penis. She started bleeding this morning. The bleeding is brown and enough that she has to wear a pad. She is feeling occasional cramping in her lower abdomen.   OB History    Gravida Para Term Preterm AB Living   3 1   1 1  0   SAB TAB Ectopic Multiple Live Births   0 1     0      Past Medical History:  Diagnosis Date  . Preterm labor   . UTI (urinary tract infection) 2010   during pregnancy    Past Surgical History:  Procedure Laterality Date  . CERVICAL CERCLAGE N/A 09/11/2016   Procedure: CERCLAGE CERVICAL;  Surgeon: Adam PhenixJames G Arnold, MD;  Location: [facility];  Service: Gynecology;  Laterality: N/A;  . NO PAST SURGERIES      Family History  Problem Relation Age of Onset  . Hypertension Father   . Heart disease Father        has a pacemaker    Social History  Substance Use Topics  . Smoking status: Former Smoker    Years: 0.50    Types: Cigarettes  . Smokeless tobacco: Never Used     Comment: 1-2 PER DAY  . Alcohol use No    Allergies: No Known Allergies  Prescriptions Prior to Admission  Medication Sig Dispense Refill Last Dose  . miconazole (MONISTAT 7) 2 % vaginal cream Place 1 Applicatorful vaginally at bedtime. Apply for seven nights (Patient not taking: Reported on 09/24/2016) 30 g 2 Not Taking  . oxyCODONE-acetaminophen (PERCOCET/ROXICET) 5-325 MG tablet Take 1-2 tablets by mouth every 6 (six) hours as needed. (Patient not taking: Reported on 09/16/2016) 10 tablet 0 Not Taking   . Prenatal Vit-Fe Fumarate-FA (PRENATAL MULTIVITAMIN) TABS tablet Take 1 tablet by mouth daily.   Taking   Results for orders placed or performed during the hospital encounter of 11/15/16 (from the past 48 hour(s))  Urinalysis, Routine w reflex microscopic     Status: Abnormal   Collection Time: 11/15/16 12:46 PM  Result Value Ref Range   Color, Urine YELLOW YELLOW   APPearance CLEAR CLEAR   Specific Gravity, Urine 1.020 1.005 - 1.030   pH 7.0 5.0 - 8.0   Glucose, UA NEGATIVE NEGATIVE mg/dL   Hgb urine dipstick NEGATIVE NEGATIVE   Bilirubin Urine NEGATIVE NEGATIVE   Ketones, ur NEGATIVE NEGATIVE mg/dL   Protein, ur NEGATIVE NEGATIVE mg/dL   Nitrite NEGATIVE NEGATIVE   Leukocytes, UA TRACE (A) NEGATIVE  Urinalysis, Microscopic (reflex)     Status: Abnormal   Collection Time: 11/15/16 12:46 PM  Result Value Ref Range   RBC / HPF NONE SEEN 0 - 5 RBC/hpf   WBC, UA 0-5 0 - 5 WBC/hpf   Bacteria, UA RARE (A) NONE SEEN   Squamous Epithelial / LPF 0-5 (A) NONE SEEN   Mucous PRESENT   Type and screen [facility]      Status: None (Preliminary result)  Collection Time: 11/15/16  3:03 PM  Result Value Ref Range   ABO/RH(D) O POS    Antibody Screen PENDING    Sample Expiration 11/18/2016     Review of Systems  Gastrointestinal: Positive for abdominal pain (Occasional cramping ).  Genitourinary: Positive for vaginal bleeding.   Physical Exam   Blood pressure 109/67, pulse 93, temperature 98.3 F (36.8 C), temperature source Oral, resp. rate 18, weight 173 lb (78.5 kg), last menstrual period 04/09/2016, SpO2 99 %.  Physical Exam  Constitutional: She is oriented to person, place, and time. She appears well-developed and well-nourished. No distress.  GI: Soft. She exhibits no distension. There is no tenderness. There is no rebound and no guarding.  Genitourinary:  Genitourinary Comments: Vagina - Small amount of clear/pink vaginal discharge, no odor  Cervix -  bulging membranes  Bimanual exam: deferred  Chaperone present for exam.   Musculoskeletal: Normal range of motion.  Neurological: She is alert and oriented to person, place, and time.  Skin: Skin is warm. She is not diaphoretic.  Psychiatric: Her behavior is normal.   Fetal Tracing: Baseline:150 bpm Variability: Moderate  Accelerations: 10x10 Decelerations: quick variables  Toco: none  MAU Course  Procedures  None  MDM  Discussed exam with Dr. Alysia Penna Admit  Betamethasone given  Assessment and Plan   A:  Threatened preterm labor, second trimester  Vaginal bleeding in pregnancy, second trimester   P:  Admit to high risk OB Betamethasone Continuous fetal monitoring Comp + 14 MFM [place]  Duane Lope, NP 11/15/2016 3:57 PM

## 2016-11-15 NOTE — MAU Note (Signed)
Patient states has cerclage. Yesterday saw a piece of stitch when she went to the bathroom. Then today noted brown vaginal bleeding; currently wearing pad. Patient reports "a little bit of abdominal cramping" at times but none now. +FM. Denies LOF.

## 2016-11-15 NOTE — H&P (Signed)
Tamara Cowan is a 26 y.o. female  G3P0110 IUP 25 3/7 wks presenting to MAU with C/O vaginal bleeding. Pt states she was getting ready for work this morning and noted some dark brown spotting. She denies any cramps, LOF, or vaginal pressure. She does report having IC last evening and noted what appeared to be the cerclage came out.  Pt with known incompetent cervix with PTB in 2010 at 20 weeks.  She had a Prolene cerclage placed 10/01/16. Last CL was 1.7-2.1 cm on 10/08/16 Her last OBV was 10/20/16. OB History    Gravida Para Term Preterm AB Living   3 1   1 1  0   SAB TAB Ectopic Multiple Live Births   0 1     0     Past Medical History:  Diagnosis Date  . Preterm labor   . UTI (urinary tract infection) 2010   during pregnancy   Past Surgical History:  Procedure Laterality Date  . CERVICAL CERCLAGE N/A 09/11/2016   Procedure: CERCLAGE CERVICAL;  Surgeon: Tamara PhenixJames G Arnold, MD;  Location: [facility];  Service: Gynecology;  Laterality: N/A;  . NO PAST SURGERIES     Family History: family history includes Heart disease in her father; Hypertension in her father. Social History:  reports that she has quit smoking. Her smoking use included Cigarettes. She quit after 0.50 years of use. She has never used smokeless tobacco. She reports that she does not drink alcohol or use drugs.     Maternal Diabetes: No Genetic Screening: Normal Maternal Ultrasounds/Referrals: Abnormal:  Findings:   Other: Fetal Ultrasounds or other Referrals:  None Maternal Substance Abuse:  No Significant Maternal Medications:  None Significant Maternal Lab Results:  None Other Comments:  None  Review of Systems  Constitutional: Negative.   Respiratory: Negative.   Cardiovascular: Negative.   Gastrointestinal: Negative.   Genitourinary: Negative.    History   Blood pressure 109/67, pulse 93, temperature 98.3 F (36.8 C), temperature source Oral, resp. rate 18, weight 78.5 kg (173 lb), last menstrual period  04/09/2016, SpO2 99 %. Exam Physical Exam  Constitutional: She appears well-developed and well-nourished.  Tearful  Cardiovascular: Normal rate and normal heart sounds.   Respiratory: Effort normal and breath sounds normal.  GI: Soft. Bowel sounds are normal.  Gravid, FH 25 cm  Genitourinary:  Genitourinary Comments: As per NP Hour glassing membranes noted, no bleeding  Musculoskeletal: She exhibits no edema.    Prenatal labs: ABO, Rh: --/--/O POS, O POS (03/10 0920) Antibody: NEG (03/10 0920) Rubella: Immune (02/19 0000) RPR: Nonreactive (02/19 0000)  HBsAg: Negative (02/19 0000)  HIV: Non-reactive (02/19 0000)  GBS:     Assessment/Plan: IUP 25 3/7 weeks Incompetent cervix  Now with hour glassing membranes and possible loss of cerclage. Will admit to [facility] floor. Antibiotics, steroids for FLM, [place], strict bedrest with foley and U/S. Neonatology consult. No evidence of PTL at present thus will hold tocolysis. Pending U/S results consider progesterone supplement ation and or replacement of cerclage. Risks of SROM, infection, prematurity and PTD discussed with pt. POC reviewed. Pt verbalized understanding.   Tamara Cowan 11/15/2016, 3:47 PM

## 2016-11-15 NOTE — Progress Notes (Signed)
Patient ID: Tamara GrumblesMiyesha Cowan, female   DOB: 06/17/1991, 26 y.o.   MRN: [id number] No evidence of contractions since admission. Official U/S report pending per U/S tech no measurable cervix noted Will start Progesterone

## 2016-11-16 ENCOUNTER — Encounter: Payer: Medicaid Other | Admitting: Obstetrics & Gynecology

## 2016-11-16 MED ORDER — TERBUTALINE SULFATE 1 MG/ML IJ SOLN
0.2500 mg | Freq: Once | INTRAMUSCULAR | Status: AC
  Administered 2016-11-16: 0.25 mg via SUBCUTANEOUS
  Filled 2016-11-16: qty 1

## 2016-11-16 NOTE — Progress Notes (Signed)
Patient ID: Tamara Cowan, female   DOB: 01/28/1991, 26 y.o.   MRN: [id number] [facility] ANTEPARTUM COMPREHENSIVE PROGRESS NOTE  Tamara Cowan is a 26 y.o. G3P0110 at 4914w4d  who is admitted for incompetent cervix.   Fetal presentation is unsure. Length of Stay:  1  Days  Subjective: Pt without complaints this morning.  Patient reports good fetal movement.  She reports no uterine contractions, no bleeding and no loss of fluid per vagina.  Vitals:  Blood pressure 92/60, pulse 91, temperature 97.8 F (36.6 C), temperature source Oral, resp. rate 18, height 5\' 7"  (1.702 m), weight 78.5 kg (173 lb), last menstrual period 04/09/2016, SpO2 100 %.   Physical Examination: Lungs clear Heart RRR Abd soft + BS gravid non tender Ext SCD's  Fetal Monitoring:  140's   Labs:  Results for orders placed or performed during the hospital encounter of 11/15/16 (from the past 24 hour(s))  Urinalysis, Routine w reflex microscopic   Collection Time: 11/15/16 12:46 PM  Result Value Ref Range   Color, Urine YELLOW YELLOW   APPearance CLEAR CLEAR   Specific Gravity, Urine 1.020 1.005 - 1.030   pH 7.0 5.0 - 8.0   Glucose, UA NEGATIVE NEGATIVE mg/dL   Hgb urine dipstick NEGATIVE NEGATIVE   Bilirubin Urine NEGATIVE NEGATIVE   Ketones, ur NEGATIVE NEGATIVE mg/dL   Protein, ur NEGATIVE NEGATIVE mg/dL   Nitrite NEGATIVE NEGATIVE   Leukocytes, UA TRACE (A) NEGATIVE  Urinalysis, Microscopic (reflex)   Collection Time: 11/15/16 12:46 PM  Result Value Ref Range   RBC / HPF NONE SEEN 0 - 5 RBC/hpf   WBC, UA 0-5 0 - 5 WBC/hpf   Bacteria, UA RARE (A) NONE SEEN   Squamous Epithelial / LPF 0-5 (A) NONE SEEN   Mucous PRESENT   CBC   Collection Time: 11/15/16  3:03 PM  Result Value Ref Range   WBC 6.4 4.0 - 10.5 K/uL   RBC 3.86 (L) 3.87 - 5.11 MIL/uL   Hemoglobin 10.9 (L) 12.0 - 15.0 g/dL   HCT 40.933.1 (L) 81.136.0 - 91.446.0 %   MCV 85.8 78.0 - 100.0 fL   MCH 28.2 26.0 - 34.0 pg   MCHC 32.9 30.0 - 36.0 g/dL   RDW 78.214.1 95.611.5 - 21.315.5 %   Platelets 193 150 - 400 K/uL  Type and screen [facility]   Collection Time: 11/15/16  3:03 PM  Result Value Ref Range   ABO/RH(D) O POS    Antibody Screen NEG    Sample Expiration 11/18/2016     Imaging Studies:    U/S 11/15/16   Medications:  Scheduled . [START ON 11/17/2016] amoxicillin  500 mg Oral Q8H  . betamethasone acetate-betamethasone sodium phosphate  12 mg Intramuscular Once  . docusate sodium  100 mg Oral Daily  . phenazopyridine  200 mg Oral TID WC  . prenatal multivitamin  1 tablet Oral Q1200  . progesterone  200 mg Vaginal QHS   I have reviewed the patient's current medications.  ASSESSMENT: IUP 25 4/7 weeks Incompetent cervix  PLAN: Stable. Continue with antibiotics. BMZ for FLM, second dose today. Continue with bed rest. Continue with progesterone.  Continue routine antenatal care.   Hermina StaggersMichael L Ladeja Pelham 11/16/2016,7:19 AM

## 2016-11-17 ENCOUNTER — Inpatient Hospital Stay (HOSPITAL_COMMUNITY): Payer: Medicaid Other | Admitting: Anesthesiology

## 2016-11-17 ENCOUNTER — Encounter (HOSPITAL_COMMUNITY): Payer: Self-pay

## 2016-11-17 ENCOUNTER — Encounter (HOSPITAL_COMMUNITY)
Admission: AD | Disposition: A | Discharge status: Home / Self Care | Payer: Self-pay | Source: Ambulatory Visit | Attending: Obstetrics and Gynecology

## 2016-11-17 DIAGNOSIS — O4702 False labor before 37 completed weeks of gestation, second trimester: Secondary | ICD-10-CM

## 2016-11-17 DIAGNOSIS — O321XX0 Maternal care for breech presentation, not applicable or unspecified: Secondary | ICD-10-CM

## 2016-11-17 DIAGNOSIS — O3432 Maternal care for cervical incompetence, second trimester: Secondary | ICD-10-CM

## 2016-11-17 DIAGNOSIS — Z3A25 25 weeks gestation of pregnancy: Secondary | ICD-10-CM

## 2016-11-17 LAB — URINE CULTURE
Culture: >=100000 — AB
SPECIAL REQUESTS: NORMAL

## 2016-11-17 SURGERY — Surgical Case
Anesthesia: Spinal

## 2016-11-17 MED ORDER — MORPHINE SULFATE (PF) 0.5 MG/ML IJ SOLN
INTRAMUSCULAR | Status: DC | PRN
Start: 2016-11-17 — End: 2016-11-17
  Administered 2016-11-17: .2 mg via INTRATHECAL

## 2016-11-17 MED ORDER — ONDANSETRON HCL 4 MG/2ML IJ SOLN
INTRAMUSCULAR | Status: AC
Start: 2016-11-17 — End: 2016-11-17
  Filled 2016-11-17: qty 2

## 2016-11-17 MED ORDER — HYDROMORPHONE HCL 1 MG/ML IJ SOLN: 0.2500 mg | INTRAMUSCULAR | Status: DC | PRN

## 2016-11-17 MED ORDER — SENNOSIDES-DOCUSATE SODIUM 8.6-50 MG PO TABS: 1.0000 | ORAL_TABLET | Freq: Every evening | ORAL | Status: DC | PRN

## 2016-11-17 MED ORDER — OXYCODONE HCL 5 MG PO TABS
10.0000 mg | ORAL_TABLET | ORAL | Status: DC | PRN
  Administered 2016-11-18 – 2016-11-20 (×4): 10 mg via ORAL
  Filled 2016-11-17 (×4): qty 2

## 2016-11-17 MED ORDER — WITCH HAZEL-GLYCERIN EX PADS: 1.0000 "application " | MEDICATED_PAD | CUTANEOUS | Status: DC | PRN

## 2016-11-17 MED ORDER — NALOXONE HCL 0.4 MG/ML IJ SOLN
0.4000 mg | INTRAMUSCULAR | Status: DC | PRN
Start: 2016-11-17 — End: 2016-11-20

## 2016-11-17 MED ORDER — LACTATED RINGERS IV SOLN
INTRAVENOUS | Status: DC
  Administered 2016-11-17 – 2016-11-18 (×3): via INTRAVENOUS

## 2016-11-17 MED ORDER — KETOROLAC TROMETHAMINE 30 MG/ML IJ SOLN: 30.0000 mg | Freq: Four times a day (QID) | INTRAMUSCULAR | Status: AC | PRN

## 2016-11-17 MED ORDER — SOD CITRATE-CITRIC ACID 500-334 MG/5ML PO SOLN
ORAL | Status: AC
  Administered 2016-11-17: 30 mL
  Filled 2016-11-17: qty 15

## 2016-11-17 MED ORDER — LACTATED RINGERS IV SOLN
INTRAVENOUS | Status: DC | PRN
  Administered 2016-11-17: 10:00:00 via INTRAVENOUS

## 2016-11-17 MED ORDER — SOD CITRATE-CITRIC ACID 500-334 MG/5ML PO SOLN: 30.0000 mL | ORAL | Status: DC

## 2016-11-17 MED ORDER — CEFAZOLIN SODIUM-DEXTROSE 2-4 GM/100ML-% IV SOLN
2.0000 g | INTRAVENOUS | Status: AC
  Administered 2016-11-17: 2 g via INTRAVENOUS
  Filled 2016-11-17: qty 100

## 2016-11-17 MED ORDER — NALBUPHINE HCL 10 MG/ML IJ SOLN: 5.0000 mg | Freq: Once | INTRAMUSCULAR | Status: DC | PRN

## 2016-11-17 MED ORDER — SODIUM CHLORIDE 0.9% FLUSH: 3.0000 mL | INTRAVENOUS | Status: DC | PRN

## 2016-11-17 MED ORDER — SODIUM CHLORIDE 0.9 % IR SOLN
Status: DC | PRN
  Administered 2016-11-17: 800 mL

## 2016-11-17 MED ORDER — DIPHENHYDRAMINE HCL 50 MG/ML IJ SOLN: 12.5000 mg | INTRAMUSCULAR | Status: DC | PRN

## 2016-11-17 MED ORDER — MENTHOL 3 MG MT LOZG: 1.0000 | LOZENGE | OROMUCOSAL | Status: DC | PRN

## 2016-11-17 MED ORDER — SIMETHICONE 80 MG PO CHEW
80.0000 mg | CHEWABLE_TABLET | Freq: Three times a day (TID) | ORAL | Status: DC
  Administered 2016-11-17 – 2016-11-20 (×7): 80 mg via ORAL
  Filled 2016-11-17 (×7): qty 1

## 2016-11-17 MED ORDER — PHENYLEPHRINE 8 MG IN D5W 100 ML (0.08MG/ML) PREMIX OPTIME
INJECTION | INTRAVENOUS | Status: DC | PRN
  Administered 2016-11-17: 60 ug/min via INTRAVENOUS

## 2016-11-17 MED ORDER — MAGNESIUM SULFATE 2 GM/50ML IV SOLN: 2.0000 g | Freq: Once | INTRAVENOUS | Status: DC

## 2016-11-17 MED ORDER — TETANUS-DIPHTH-ACELL PERTUSSIS 5-2.5-18.5 LF-MCG/0.5 IM SUSP
0.5000 mL | Freq: Once | INTRAMUSCULAR | Status: AC
  Administered 2016-11-20: 0.5 mL via INTRAMUSCULAR
  Filled 2016-11-17: qty 0.5

## 2016-11-17 MED ORDER — ACETAMINOPHEN 325 MG PO TABS
650.0000 mg | ORAL_TABLET | ORAL | Status: DC | PRN
  Administered 2016-11-18 (×2): 650 mg via ORAL
  Filled 2016-11-17 (×2): qty 2

## 2016-11-17 MED ORDER — PHENYLEPHRINE 8 MG IN D5W 100 ML (0.08MG/ML) PREMIX OPTIME
INJECTION | INTRAVENOUS | Status: AC
  Filled 2016-11-17: qty 100

## 2016-11-17 MED ORDER — BUPIVACAINE IN DEXTROSE 0.75-8.25 % IT SOLN
INTRATHECAL | Status: DC | PRN
  Administered 2016-11-17: 1.6 mL via INTRATHECAL

## 2016-11-17 MED ORDER — OXYTOCIN 40 UNITS IN LACTATED RINGERS INFUSION - SIMPLE MED: 2.5000 [IU]/h | INTRAVENOUS | Status: AC

## 2016-11-17 MED ORDER — OXYCODONE HCL 5 MG/5ML PO SOLN: 5.0000 mg | Freq: Once | ORAL | Status: DC | PRN

## 2016-11-17 MED ORDER — DIPHENHYDRAMINE HCL 25 MG PO CAPS: 25.0000 mg | ORAL_CAPSULE | Freq: Four times a day (QID) | ORAL | Status: DC | PRN

## 2016-11-17 MED ORDER — NALBUPHINE HCL 10 MG/ML IJ SOLN: 5.0000 mg | INTRAMUSCULAR | Status: DC | PRN

## 2016-11-17 MED ORDER — SOD CITRATE-CITRIC ACID 500-334 MG/5ML PO SOLN: 30.0000 mL | Freq: Once | ORAL | Status: DC

## 2016-11-17 MED ORDER — FENTANYL CITRATE (PF) 100 MCG/2ML IJ SOLN
INTRAMUSCULAR | Status: AC
  Filled 2016-11-17: qty 2

## 2016-11-17 MED ORDER — MAGNESIUM SULFATE BOLUS VIA INFUSION
4.0000 g | Freq: Once | INTRAVENOUS | Status: AC
  Administered 2016-11-17: 4 g via INTRAVENOUS
  Filled 2016-11-17: qty 500

## 2016-11-17 MED ORDER — NALOXONE HCL 2 MG/2ML IJ SOSY
1.0000 ug/kg/h | PREFILLED_SYRINGE | INTRAVENOUS | Status: DC | PRN
  Filled 2016-11-17: qty 2

## 2016-11-17 MED ORDER — IBUPROFEN 600 MG PO TABS
600.0000 mg | ORAL_TABLET | Freq: Four times a day (QID) | ORAL | Status: DC | PRN
  Administered 2016-11-18 – 2016-11-20 (×5): 600 mg via ORAL
  Filled 2016-11-17 (×5): qty 1

## 2016-11-17 MED ORDER — LACTATED RINGERS IV SOLN
INTRAVENOUS | Status: DC | PRN
  Administered 2016-11-17: 40 [IU] via INTRAVENOUS

## 2016-11-17 MED ORDER — LACTATED RINGERS IV SOLN
INTRAVENOUS | Status: DC | PRN
  Administered 2016-11-17: 11:00:00 via INTRAVENOUS

## 2016-11-17 MED ORDER — COCONUT OIL OIL: 1.0000 "application " | TOPICAL_OIL | Status: DC | PRN

## 2016-11-17 MED ORDER — DIBUCAINE 1 % RE OINT: 1.0000 "application " | TOPICAL_OINTMENT | RECTAL | Status: DC | PRN

## 2016-11-17 MED ORDER — OXYTOCIN 10 UNIT/ML IJ SOLN
INTRAMUSCULAR | Status: AC
  Filled 2016-11-17: qty 4

## 2016-11-17 MED ORDER — LACTATED RINGERS IV SOLN: INTRAVENOUS | Status: DC

## 2016-11-17 MED ORDER — ONDANSETRON HCL 4 MG/2ML IJ SOLN
INTRAMUSCULAR | Status: DC | PRN
  Administered 2016-11-17: 4 mg via INTRAVENOUS

## 2016-11-17 MED ORDER — MEPERIDINE HCL 25 MG/ML IJ SOLN: 6.2500 mg | INTRAMUSCULAR | Status: DC | PRN

## 2016-11-17 MED ORDER — PROMETHAZINE HCL 25 MG/ML IJ SOLN: 6.2500 mg | INTRAMUSCULAR | Status: DC | PRN

## 2016-11-17 MED ORDER — DIPHENHYDRAMINE HCL 25 MG PO CAPS
25.0000 mg | ORAL_CAPSULE | ORAL | Status: DC | PRN
  Filled 2016-11-17: qty 1

## 2016-11-17 MED ORDER — KETOROLAC TROMETHAMINE 30 MG/ML IJ SOLN
30.0000 mg | Freq: Four times a day (QID) | INTRAMUSCULAR | Status: AC | PRN
  Administered 2016-11-17: 30 mg via INTRAMUSCULAR

## 2016-11-17 MED ORDER — FENTANYL CITRATE (PF) 100 MCG/2ML IJ SOLN
INTRAMUSCULAR | Status: DC | PRN
  Administered 2016-11-17: 10 ug via INTRATHECAL
  Administered 2016-11-17: 100 ug via INTRAVENOUS
  Administered 2016-11-17: 90 ug via INTRAVENOUS

## 2016-11-17 MED ORDER — PRENATAL MULTIVITAMIN CH
1.0000 | ORAL_TABLET | Freq: Every day | ORAL | Status: DC
  Administered 2016-11-18 – 2016-11-20 (×3): 1 via ORAL
  Filled 2016-11-17 (×3): qty 1

## 2016-11-17 MED ORDER — MAGNESIUM SULFATE 40 G IN LACTATED RINGERS - SIMPLE
2.0000 g/h | INTRAVENOUS | Status: DC
  Administered 2016-11-17: 2 g/h via INTRAVENOUS
  Administered 2016-11-17: 3 g/h via INTRAVENOUS
  Filled 2016-11-17: qty 40

## 2016-11-17 MED ORDER — POLYETHYLENE GLYCOL 3350 17 G PO PACK
17.0000 g | PACK | Freq: Every day | ORAL | Status: DC
  Administered 2016-11-18 – 2016-11-20 (×3): 17 g via ORAL
  Filled 2016-11-17 (×3): qty 1

## 2016-11-17 MED ORDER — KETOROLAC TROMETHAMINE 30 MG/ML IJ SOLN
INTRAMUSCULAR | Status: AC
  Filled 2016-11-17: qty 1

## 2016-11-17 MED ORDER — MORPHINE SULFATE (PF) 0.5 MG/ML IJ SOLN
INTRAMUSCULAR | Status: AC
  Filled 2016-11-17: qty 10

## 2016-11-17 MED ORDER — OXYCODONE HCL 5 MG PO TABS: 5.0000 mg | ORAL_TABLET | Freq: Once | ORAL | Status: DC | PRN

## 2016-11-17 MED ORDER — OXYCODONE HCL 5 MG PO TABS
5.0000 mg | ORAL_TABLET | ORAL | Status: DC | PRN
  Administered 2016-11-18 – 2016-11-19 (×3): 5 mg via ORAL
  Filled 2016-11-17 (×3): qty 1

## 2016-11-17 MED ORDER — SCOPOLAMINE 1 MG/3DAYS TD PT72: 1.0000 | MEDICATED_PATCH | Freq: Once | TRANSDERMAL | Status: DC

## 2016-11-17 MED ORDER — ONDANSETRON HCL 4 MG/2ML IJ SOLN: 4.0000 mg | Freq: Three times a day (TID) | INTRAMUSCULAR | Status: DC | PRN

## 2016-11-17 SURGICAL SUPPLY — 35 items
BENZOIN TINCTURE PRP APPL 2/3 (GAUZE/BANDAGES/DRESSINGS) ×3 IMPLANT
CANISTER SUCT 3000ML PPV (MISCELLANEOUS) ×3 IMPLANT
CHLORAPREP W/TINT 26ML (MISCELLANEOUS) ×3 IMPLANT
CLOSURE WOUND 1/2 X4 (GAUZE/BANDAGES/DRESSINGS) ×1
DERMABOND ADVANCED (GAUZE/BANDAGES/DRESSINGS) ×2
DERMABOND ADVANCED .7 DNX12 (GAUZE/BANDAGES/DRESSINGS) ×1 IMPLANT
DRSG OPSITE POSTOP 4X10 (GAUZE/BANDAGES/DRESSINGS) ×3 IMPLANT
ELECT REM PT RETURN 9FT ADLT (ELECTROSURGICAL) ×3
ELECTRODE REM PT RTRN 9FT ADLT (ELECTROSURGICAL) ×1 IMPLANT
GLOVE BIOGEL PI IND STRL 7.0 (GLOVE) ×2 IMPLANT
GLOVE BIOGEL PI IND STRL 7.5 (GLOVE) ×1 IMPLANT
GLOVE BIOGEL PI INDICATOR 7.0 (GLOVE) ×4
GLOVE BIOGEL PI INDICATOR 7.5 (GLOVE) ×2
GLOVE SKINSENSE NS SZ7.0 (GLOVE) ×2
GLOVE SKINSENSE STRL SZ7.0 (GLOVE) ×1 IMPLANT
GOWN STRL REUS W/ TWL LRG LVL3 (GOWN DISPOSABLE) ×2 IMPLANT
GOWN STRL REUS W/ TWL XL LVL3 (GOWN DISPOSABLE) ×1 IMPLANT
GOWN STRL REUS W/TWL LRG LVL3 (GOWN DISPOSABLE) ×4
GOWN STRL REUS W/TWL XL LVL3 (GOWN DISPOSABLE) ×2
KIT ABG SYR 3ML LUER SLIP (SYRINGE) ×3 IMPLANT
NEEDLE HYPO 25X5/8 SAFETYGLIDE (NEEDLE) ×3 IMPLANT
NS IRRIG 1000ML POUR BTL (IV SOLUTION) ×3 IMPLANT
PACK C SECTION WH (CUSTOM PROCEDURE TRAY) ×3 IMPLANT
PAD OB MATERNITY 4.3X12.25 (PERSONAL CARE ITEMS) ×3 IMPLANT
PAD PREP 24X48 CUFFED NSTRL (MISCELLANEOUS) ×3 IMPLANT
SPONGE LAP 18X18 X RAY DECT (DISPOSABLE) ×6 IMPLANT
STRIP CLOSURE SKIN 1/2X4 (GAUZE/BANDAGES/DRESSINGS) ×2 IMPLANT
SUT MON AB 4-0 PS1 27 (SUTURE) ×3 IMPLANT
SUT MON AB-0 CT1 36 (SUTURE) ×9 IMPLANT
SUT PLAIN 2 0 (SUTURE) ×2
SUT PLAIN ABS 2-0 CT1 27XMFL (SUTURE) ×1 IMPLANT
SUT VIC AB 0 CT1 36 (SUTURE) ×6 IMPLANT
SUT VIC AB 3-0 CT1 27 (SUTURE) ×2
SUT VIC AB 3-0 CT1 TAPERPNT 27 (SUTURE) ×1 IMPLANT
SUT VIC AB 4-0 KS 27 (SUTURE) ×3 IMPLANT

## 2016-11-17 NOTE — Op Note (Signed)
Operative Note   SURGERY DATE: 11/17/2016  PRE-OP DIAGNOSIS:  *Pregnancy @ 25/5 weeks *Breech *Preterm labor  POST-OP DIAGNOSIS: Same   PROCEDURE: primary low transverse cesarean section via pfannenstiel skin incision with double layer uterine closure  SURGEON: Surgeon(s) and Role:    * [place] BingPickens, Jaasia Viglione, MD - Primary  ASSISTANT:    Lorne Skeens* Schenk, Nicholas Michael, MD - Fellow  ANESTHESIA: spinal  ESTIMATED BLOOD LOSS: 600mL  DRAINS: 300mL UOP via indwelling foley  TOTAL IV FLUIDS: 1200mL crystalloid  VTE PROPHYLAXIS: SCDs to bilateral lower extremities  ANTIBIOTICS: Two grams of Cefazolin were given., within 1 hour of skin incision  SPECIMENS: placenta  COMPLICATIONS: none  FINDINGS: No intra-abdominal adhesions were noted. Grossly normal uterus, tubes and ovaries. clear amniotic fluid, footling breech female infant, weight 960gm, APGARs 4/9, intact placenta. Unable to draw arterial or venous gases because of lack of cord blood.   PROCEDURE IN DETAIL: The patient was taken to the operating room where anesthesia was administered and normal fetal heart tones were confirmed. She was then prepped and draped in the normal fashion in the dorsal supine position with a leftward tilt.  After a time out was performed, a pfannensteil  skin incision was made with the scalpel and carried through to the underlying layer of fascia. The fascia was then incised at the midline and this incision was extended laterally with the mayo scissors. Attention was turned to the superior aspect of the fascial incision which was grasped with the kocher clamps x 2, tented up and the rectus muscles were dissected off with the bovie. In a similar fashion the inferior aspect of the fascial incision was grasped with the kocher clamps, tented up and the rectus muscles dissected off with the mayo scissors. The rectus muscles were then separated in the midline and the peritoneum was entered bluntly. The bladder blade was  inserted and the vesicouterine peritoneum was identified, tented up and entered with the metzenbaum scissors. This incision was extended laterally and the bladder flap was created digitally. The bladder blade was reinserted.  A low transverse hysterotomy was made with the scalpel until the endometrial cavity was breached and the amniotic sac ruptured yielding clear amniotic fluid.  The incision was extended bluntly. The lower extremities were low in the pelvis and the fetus was easily delivered breech with the standard maneuvers.  he cord was clamped x 2 and cut, and the infant was handed to the awaiting pediatricians. Delayed cord clamping was not done.  The placenta was then gradually expressed from the uterus and then the uterus was exteriorized and cleared of all clots and debris. The hysterotomy was repaired with a running suture of 1-0 monocryl. A second imbricating layer of 1-0 monocryl suture was then placed to achieve excellent hemostasis.   The uterus and adnexa were then returned to the abdomen, and the hysterotomy and all operative sites were reinspected and excellent hemostasis was noted after irrigation and suction of the abdomen with warm saline.  The peritoneum was closed with a running stitch of 3-0 Vicryl. The fascia was reapproximated with 0 Vicryl in a simple running fashion bilaterally.  The skin was then closed with 4-0 monocryl, in a subcuticular fashion.  The patient  tolerated the procedure well. Sponge, lap, needle, and instrument counts were correct x 2. The patient was transferred to the recovery room awake, alert and breathing independently in stable condition.  Cornelia Copaharlie Kendric Sindelar, Jr. MD Attending Center for [facility](Faculty Practice)

## 2016-11-17 NOTE — Anesthesia Postprocedure Evaluation (Signed)
Anesthesia Post Note  Patient: [name]  Procedure(s) Performed: Procedure(s) (LRB): CESAREAN SECTION (N/A)  Patient location during evaluation: PACU Anesthesia Type: Spinal Level of consciousness: oriented and awake and alert Pain management: pain level controlled Vital Signs Assessment: post-procedure vital signs reviewed and stable Respiratory status: spontaneous breathing and respiratory function stable Cardiovascular status: blood pressure returned to baseline and stable Postop Assessment: no headache and no backache Anesthetic complications: no        Last Vitals:  Vitals:   11/17/16 1200 11/17/16 1215  BP: 120/78 97/71  Pulse: 85 87  Resp: 15 19  Temp: 36.9 C     Last Pain:  Vitals:   11/17/16 1200  TempSrc: Axillary  PainSc: 0-No pain   Pain Goal:                 [name]

## 2016-11-17 NOTE — Lactation Note (Signed)
This note was copied from a baby's chart. Lactation Consultation Note  Patient Name: Tamara Cowan's Date: 11/17/2016 Reason for consult: Initial assessment;NICU baby;Infant < 6lbs;Other (Comment) (25,5 weeks)   Initial consult for mom of NICU baby; GA 25.5; BW 2 lbs, 2 oz.  Mom is a P1.   Reviewed with mom hand expression with return demonstration and several drops of colostrum collected in container. Taught mom how to pump using DEBP on "initiate" setting turning dial up to 3-4 drops.  Mom was able to teach-back how to use pump. Initiated mom pumping; #24 flanges appropriate fit but mom has semi-large nipples.   Explained to mom she may need to increase flange size as breast size increases with milk production to #27 if nipples begin to blanch or rub on sides or becomes uncomfortable with pumping.   Encouraged mom to pump every 2 hrs during the day and at least once during the night for a total of at least 8 times per day. Taught hands on pumping with hand expression at end of pumping session.   Reviewed NICU booklet and started mom's pumping log.   Reviewed cleaning of pump parts and transporting milk to hospital after discharge. Gave extra colostrum containers, curved-tip syringe, yellow dots, basin, and soap.  Explained use of all equipment.  WIC Referral completed and Faxed. Mom will need WIC Loaner.  [facility] and explained how to complete for day of discharge with $30 exact cash to complete loaner.   Encouraged to call for questions as needed.     Maternal Data Has patient been taught Hand Expression?: Yes Does the patient have breastfeeding experience prior to this delivery?: No   Type of Nipple: Everted at rest and after stimulation  Comfort (Breast/Nipple): Soft / non-tender    Lactation Tools Discussed/Used WIC Program: Yes Pump Review: Setup, frequency, and cleaning;Milk Storage Initiated by:: S.Dondre Catalfamo, RN, IBCLC Date initiated::  11/17/16   Consult Status Consult Status: Follow-up Date: 11/18/16 Follow-up type: In-patient    Lendon KaVann, Clarice Bonaventure Walker 11/17/2016, 6:16 PM

## 2016-11-17 NOTE — Transfer of Care (Signed)
Immediate Anesthesia Transfer of Care Note  Patient: Tamara Cowan  Procedure(s) Performed: Procedure(s): CESAREAN SECTION (N/A)  Patient Location: PACU  Anesthesia Type:Spinal  Level of Consciousness: awake, alert  and oriented  Airway & Oxygen Therapy: Patient Spontanous Breathing  Post-op Assessment: Report given to RN and Post -op Vital signs reviewed and stable  Post vital signs: Reviewed and stable  Last Vitals:  Vitals:   11/17/16 0800 11/17/16 0900  BP: 108/68 100/65  Pulse: 95 83  Resp:    Temp:      Last Pain:  Vitals:   11/17/16 0756  TempSrc:   PainSc: 4          Complications: No apparent anesthesia complications and Patient re-intubated

## 2016-11-17 NOTE — Addendum Note (Signed)
Addendum  created 11/17/16 1506 by Elgie CongoMalinova, Zachrey Deutscher H, CRNA   Sign clinical note

## 2016-11-17 NOTE — Anesthesia Preprocedure Evaluation (Signed)
Anesthesia Evaluation  Patient identified by MRN, date of birth, ID band Patient awake    Reviewed: Allergy & Precautions, H&P , NPO status , Patient's Chart, lab work & pertinent test results  Airway Mallampati: I  TM Distance: >3 FB Neck ROM: full    Dental no notable dental hx.    Pulmonary neg pulmonary ROS, former smoker,    Pulmonary exam normal        Cardiovascular negative cardio ROS Normal cardiovascular exam     Neuro/Psych negative neurological ROS  negative psych ROS   GI/Hepatic negative GI ROS, Neg liver ROS,   Endo/Other  negative endocrine ROS  Renal/GU negative Renal ROS     Musculoskeletal negative musculoskeletal ROS (+)   Abdominal Normal abdominal exam  (+)   Peds  Hematology negative hematology ROS (+)   Anesthesia Other Findings   Reproductive/Obstetrics (+) Pregnancy                             Anesthesia Physical  Anesthesia Plan  ASA: II and emergent  Anesthesia Plan: Spinal   Post-op Pain Management:    Induction:   Airway Management Planned:   Additional Equipment:   Intra-op Plan:   Post-operative Plan:   Informed Consent: I have reviewed the patients History and Physical, chart, labs and discussed the procedure including the risks, benefits and alternatives for the proposed anesthesia with the patient or authorized representative who has indicated his/her understanding and acceptance.     Plan Discussed with: CRNA and Surgeon  Anesthesia Plan Comments:         Anesthesia Quick Evaluation

## 2016-11-17 NOTE — Progress Notes (Signed)
Patient ID: Tamara GrumblesMiyesha Cowan, female   DOB: 09/29/1990, 26 y.o.   MRN: [id number] FACULTY PRACTICE ANTEPARTUM COMPREHENSIVE PROGRESS NOTE  Tamara Cowan is a 26 y.o. G3P0110 at 628w5d  who is admitted for incompetent cervix.   Fetal presentation is cephalic. Length of Stay:  2  Days  Subjective: Patient complaining of intermittent lower abdominal pain s/p bowel movement this morning, improving with magnesium sulfate but still present Patient reports good fetal movement.  She reports no uterine contractions, no bleeding and no loss of fluid per vagina.  Vitals:  Blood pressure (!) 116/56, pulse (!) 101, temperature 98.6 F (37 C), temperature source Oral, resp. rate 16, height 5\' 7"  (1.702 m), weight 173 lb (78.5 kg), last menstrual period 04/09/2016, SpO2 99 %.   Physical Examination: Lungs clear  Heart RRR Abd soft + BS gravid non tender Ext SCD's  Fetal Monitoring:  Baseline: 150 bpm, Variability: Good {> 6 bpm), Accelerations: Non-reactive but appropriate for gestational age and Decelerations: Absent  Toco: irregular contractions q 10 minutes  Labs:  No results found for this or any previous visit (from the past 24 hour(s)).  Imaging Studies:    U/S 11/15/16   Medications:  Scheduled . amoxicillin  500 mg Oral Q8H  . docusate sodium  100 mg Oral Daily  . phenazopyridine  200 mg Oral TID WC  . prenatal multivitamin  1 tablet Oral Q1200  . progesterone  200 mg Vaginal QHS   I have reviewed the patient's current medications.  ASSESSMENT: IUP 25 5/7 weeks Incompetent cervix  PLAN: Stable s/p BMZ Continue with antibiotics.  Continue magnesium sulfate for tocolysis and neuroprotection Continue with progesterone. Continue with bed rest.  Continue routine antenatal care.   Kaya Pottenger 11/17/2016,6:56 AM

## 2016-11-17 NOTE — Consult Note (Signed)
[facility]  Delivery Note:  C-section       11/17/2016  9:52 AM  I was called to the operating room at the request of the patient's obstetrician (Dr. Vergie LivingPickens) for a primary c-section at 25 and 5/7 weeks for preterm labor and breech presentation.   PRENATAL HX:  This is a 26  y/o G3P0110 at 8425 and 5/[redacted] weeks gestation who was admitted on 5/14 for vaginal bleeding.  Her pregnancy has been complicated by incompetent cervix with history of preterm birth at 20 weeks, so a cerclage was placed on 3/30.  When she was admitted on 5/14 she was found to have hour glassing membranes and loss of the cerclage.  She was placed on bedres and started on ampicillin and azithromycin as well as progesterone and magnesium.  She received BMZ on 5/14 and 5/15.  She began to have contractions this morning, was found to be completely dilated, and fetus was found to be breech on ultrasound.  Deilivery was by urgent c-section with ROM at delivery, GBS unknown.    DELIVERY:  Infant was not vigorous at delivery, so cord clamping was immediate.  He was placed on warming pad, mouth and nose suctioned, and CPAP applied.  HR ~90 bpm so PPV was initiated, HR improved to 160s after ~ 1 minute of PPV of 18/5, then 20/5.  HR remained at ~160 on CPAP +5, 60%.  FiO2 gradually weaned to 21% with O2 saturations remaining in 90s.  APGARs 4 and 9.  Exam within normal limits.  Admitted to NICU on CPAP +5, 21%.   _____________________ Electronically Signed By: Maryan CharLindsey Wiatt Mahabir, MD Neonatologist

## 2016-11-17 NOTE — Anesthesia Postprocedure Evaluation (Signed)
Anesthesia Post Note  Patient: Secretary/administratorMiyesha Naron  Procedure(s) Performed: Procedure(s) (LRB): CESAREAN SECTION (N/A)  Patient location during evaluation: Women's Unit Anesthesia Type: Spinal Level of consciousness: awake and alert and oriented Pain management: pain level controlled Vital Signs Assessment: post-procedure vital signs reviewed and stable Respiratory status: spontaneous breathing and nonlabored ventilation Cardiovascular status: stable Postop Assessment: no headache, no backache, spinal receding, patient able to bend at knees, no signs of nausea or vomiting and adequate PO intake Anesthetic complications: no        Last Vitals:  Vitals:   11/17/16 1300 11/17/16 1400  BP: 127/85 121/81  Pulse: 88 88  Resp:  18  Temp:  36.9 C    Last Pain:  Vitals:   11/17/16 1400  TempSrc: Oral  PainSc: 3    Pain Goal:                 Land O'LakesMalinova,Siriyah Ambrosius Hristova

## 2016-11-17 NOTE — Progress Notes (Signed)
Dr Jolayne Pantheronstant notified pt uncomfortable with ctx every 7 mins c/o of pain in her back rectum and vagina.   Orders rec.

## 2016-11-17 NOTE — Progress Notes (Addendum)
OB Note  Went to check on patient to see how she's doing; she was put on Mg at around 0500 for uterine contraction pains.  She states it feels somewhat better but still has q5-5949m UC. No s/s of Mg toxicity. Has been NPO since MN AF VS normal and stable Fetus category I Toco: rare UCs NAD but patient had contraction in the room and had mild to moderate distress with it Foley with approx 150mL UOP (normal) SVE: no cervix palpable. approx 4-5cm of BBOW in the vagina to about +1. Fetal parts palpable.  BSUS: normal AF, breech, normal FHR  D/w her and given no palpable cx, breech, to proceed with primary c-section which she is amenable to, given risk of head entrapment; risks of prematurity d/w pt as well  BMZ #2 was on 5/15 @ 1530 hours 877gm on 5/14, efw 64% Continue Mg and d/c in OR NICU and anesthesia aware need for urgent c/s   Cornelia Copaharlie Beyonka Pitney, Jr MD Attending Center for [facility] (Faculty Practice) 11/17/2016 Time: [phone]

## 2016-11-17 NOTE — Anesthesia Procedure Notes (Signed)
Spinal  Patient location during procedure: OB Start time: 11/17/2016 10:16 AM End time: 11/17/2016 10:21 AM Staffing Anesthesiologist: Anitra LauthMILLER, Ayako Tapanes RAY Performed: anesthesiologist  Preanesthetic Checklist Completed: patient identified, surgical consent, pre-op evaluation, timeout performed, IV checked, risks and benefits discussed and monitors and equipment checked Spinal Block Patient position: sitting Prep: ChloraPrep and site prepped and draped Patient monitoring: heart rate, cardiac monitor, continuous pulse ox and blood pressure Approach: midline Location: L3-4 Injection technique: single-shot Needle Needle type: Pencan  Needle gauge: 24 G Needle length: 10 cm Assessment Sensory level: T4

## 2016-11-18 ENCOUNTER — Encounter (HOSPITAL_COMMUNITY): Payer: Self-pay | Admitting: *Deleted

## 2016-11-18 LAB — RAPID HIV SCREEN (HIV 1/2 AB+AG)
HIV 1/2 Antibodies: NONREACTIVE
HIV-1 P24 Antigen - HIV24: NONREACTIVE

## 2016-11-18 LAB — CBC
HCT: 30.5 % — ABNORMAL LOW (ref 36.0–46.0)
HEMOGLOBIN: 10.2 g/dL — AB (ref 12.0–15.0)
MCH: 28.7 pg (ref 26.0–34.0)
MCHC: 33.4 g/dL (ref 30.0–36.0)
MCV: 85.7 fL (ref 78.0–100.0)
PLATELETS: 174 10*3/uL (ref 150–400)
RBC: 3.56 MIL/uL — AB (ref 3.87–5.11)
RDW: 14 % (ref 11.5–15.5)
WBC: 11 10*3/uL — ABNORMAL HIGH (ref 4.0–10.5)

## 2016-11-18 LAB — RPR: RPR: NONREACTIVE

## 2016-11-18 NOTE — Progress Notes (Signed)
Daily Post Partum Note  Tamara Cowan is a 26 y.o. G3P021  POD#1 s/p pLTCS @ [redacted]w[redacted]d for breech, PTL.  Pregnancy c/b cervical incompetence 24hr/overnight events:  none  Subjective:  +void after foley out, +flatus, no fevers, chills, chest pain, sob, appropriate lochia. Pain controlled with PRN PO meds.   Objective:    Current Vital Signs 24h Vital Sign Ranges  T 98.3 F (36.8 C) Temp  Avg: 98.5 F (36.9 C)  Min: 98.3 F (36.8 C)  Max: 98.7 F (37.1 C)  BP 118/66 BP  Min: 97/71  Max: 133/81  HR 68 Pulse  Avg: 85.2  Min: 68  Max: 95  RR 18 Resp  Avg: 17.5  Min: 15  Max: 22  SaO2 99 % Not Delivered SpO2  Avg: 98.4 %  Min: 97 %  Max: 100 %       24 Hour I/O Current Shift I/O  Time Ins Outs 05/16 0701 - 05/17 0700 In: 1220 [P.O.:20; I.V.:1200] Out: 3275 [Urine:2675] No intake/output data recorded.    General: NAD Abdomen: +BS, soft, nttp, nd, c/d/i dressing Perineum: deferred Skin:  Warm and dry.  Cardiovascular: S1, S2 normal, no murmur, rub or gallop, regular rate and rhythm Respiratory:  Clear to auscultation bilateral. Normal respiratory effort Extremities: no c/c/e  Medications Current Facility-Administered Medications  Medication Dose Route Frequency Provider Last Rate Last Dose  . acetaminophen (TYLENOL) tablet 650 mg  650 mg Oral Q4H PRN [place] Bing, MD      . coconut oil  1 application Topical PRN [place] Bing, MD      . witch hazel-glycerin (TUCKS) pad 1 application  1 application Topical PRN [place] Bing, MD       And  . dibucaine (NUPERCAINAL) 1 % rectal ointment 1 application  1 application Rectal PRN [place] Bing, MD      . diphenhydrAMINE (BENADRYL) injection 12.5 mg  12.5 mg Intravenous Q4H PRN Lowella Curb, MD       Or  . diphenhydrAMINE (BENADRYL) capsule 25 mg  25 mg Oral Q4H PRN Lowella Curb, MD      . diphenhydrAMINE (BENADRYL) capsule 25 mg  25 mg Oral Q6H PRN [place] Bing, MD      . ibuprofen (ADVIL,MOTRIN) tablet  600 mg  600 mg Oral Q6H PRN [place] Bing, MD      . ketorolac (TORADOL) 30 MG/ML injection 30 mg  30 mg Intravenous Q6H PRN Lowella Curb, MD       Or  . ketorolac (TORADOL) 30 MG/ML injection 30 mg  30 mg Intramuscular Q6H PRN Lowella Curb, MD   30 mg at 11/17/16 1211  . lactated ringers infusion   Intravenous Continuous Lowella Curb, MD      . lactated ringers infusion   Intravenous Continuous [place] Bing, MD 125 mL/hr at 11/18/16 0217    . menthol-cetylpyridinium (CEPACOL) lozenge 3 mg  1 lozenge Oral Q2H PRN  Bing, MD      . nalbuphine (NUBAIN) injection 5 mg  5 mg Intravenous Q4H PRN Lowella Curb, MD       Or  . nalbuphine (NUBAIN) injection 5 mg  5 mg Subcutaneous Q4H PRN Lowella Curb, MD      . nalbuphine (NUBAIN) injection 5 mg  5 mg Intravenous Once PRN Lowella Curb, MD       Or  . nalbuphine (NUBAIN) injection 5 mg  5 mg Subcutaneous Once PRN Lowella Curb, MD      .  naloxone (NARCAN) 2 mg in dextrose 5 % 250 mL infusion  1-4 mcg/kg/hr Intravenous Continuous PRN Lowella CurbMiller, Warren Ray, MD      . naloxone [facility](NARCAN) injection 0.4 mg  0.4 mg Intravenous PRN Lowella CurbMiller, Warren Ray, MD       And  . sodium chloride flush (NS) 0.9 % injection 3 mL  3 mL Intravenous PRN Lowella CurbMiller, Warren Ray, MD      . ondansetron [facility](ZOFRAN) injection 4 mg  4 mg Intravenous Q8H PRN Lowella CurbMiller, Warren Ray, MD      . oxyCODONE (Oxy IR/ROXICODONE) immediate release tablet 10 mg  10 mg Oral Q4H PRN [place] BingPickens, Aliana Kreischer, MD   10 mg at 11/18/16 0542  . oxyCODONE (Oxy IR/ROXICODONE) immediate release tablet 5 mg  5 mg Oral Q4H PRN [place] BingPickens, Semone Orlov, MD      . polyethylene glycol (MIRALAX / GLYCOLAX) packet 17 g  17 g Oral Daily [place] BingPickens, Saudia Smyser, MD      . prenatal multivitamin tablet 1 tablet  1 tablet Oral Q1200 [place] BingPickens, Daishawn Lauf, MD      . scopolamine (TRANSDERM-SCOP) 1 MG/3DAYS 1.5 mg  1 patch Transdermal Once Lowella CurbMiller, Warren Ray, MD      . senna-docusate (Senokot-S) tablet 1  tablet  1 tablet Oral QHS PRN [place] BingPickens, Merel Santoli, MD      . simethicone (MYLICON) chewable tablet 80 mg  80 mg Oral TID [place]lean SkillPC Anye Brose, MD   80 mg at 11/17/16 1733  . sodium citrate-citric acid (ORACIT) solution 30 mL  30 mL Oral Once Lowella CurbMiller, Warren Ray, MD      . Tdap (BOOSTRIX) injection 0.5 mL  0.5 mL Intramuscular Once  BingPickens, Cassey Bacigalupo, MD        Labs:   Recent Labs Lab 11/15/16 1503 11/18/16 0530  WBC 6.4 11.0*  HGB 10.9* 10.2*  HCT 33.1* 30.5*  PLT 193 174   No results for input(s): NA, K, CL, CO2, BUN, CREATININE, LABGLOM, GLUCOSE, CALCIUM in the last 168 hours.  Assessment & Plan:  Pt doing well *Postpartum/postop: routine care. Pt states newborn is doing okay. Follow up RPR, hiv ordered *Dispo: likely POD#3  O POS / Rubella Immune / RPR ordered / HIV ordered / HepBsAg negative / Tdap UTD: ordered/ pap ASCUS, HPV pos / pumping  / Contraception: not d/w pt / Circ: not applicable/ Follow up: center for [facility]  Cornelia Copaharlie Satrina Magallanes, Jr. MD Attending Center for [facility](Faculty Practice)

## 2016-11-18 NOTE — Lactation Note (Signed)
This note was copied from a baby's chart. Lactation Consultation Note  Patient Name: Tamara Cowan WGNFA'OToday's Date: 11/18/2016  Mom is pumping 5-6 mls of colostrum every 3 hours.  Praised for her efforts.  [facility]  Encouraged to call with concerns/assist.   Maternal Data    Feeding    LATCH Score/Interventions                      Lactation Tools Discussed/Used     Consult Status      Huston FoleyMOULDEN, Teodora Baumgarten S 11/18/2016, 1:11 PM

## 2016-11-19 NOTE — Progress Notes (Signed)
Daily Post Partum Note  Tamara Cowan is a 26 y.o. G3P021  POD#2 s/p pLTCS @ [redacted]w[redacted]d for breech, PTL.  Pregnancy c/b cervical incompetence.  24hr/overnight events:  None  Subjective:  +Void after foley out, +flatus, no fevers, chills, chest pain, sob, appropriate lochia. Pain controlled with PRN PO meds.   Objective:    Current Vital Signs 24h Vital Sign Ranges  T 98.9 F (37.2 C) Temp  Avg: 99.3 F (37.4 C)  Min: 98.7 F (37.1 C)  Max: 100.5 F (38.1 C)  BP 126/80 BP  Min: 112/78  Max: 126/80  HR 82 Pulse  Avg: 81.8  Min: 73  Max: 87  RR 17 Resp  Avg: 16.3  Min: 16  Max: 17  SaO2 100 % Not Delivered SpO2  Avg: 100 %  Min: 100 %  Max: 100 %       24 Hour I/O Current Shift I/O  Time Ins Outs 05/17 0701 - 05/18 0700 In: -  Out: 700 [Urine:700] No intake/output data recorded.   General: NAD Abdomen: +BS, soft, nttp, nd, c/d/i dressing Perineum: deferred Skin:  Warm and dry.  Cardiovascular: S1, S2 normal, no murmur, rub or gallop, regular rate and rhythm Respiratory:  Clear to auscultation bilateral. Normal respiratory effort Extremities: no c/c/e  Medications Current Facility-Administered Medications  Medication Dose Route Frequency Provider Last Rate Last Dose  . acetaminophen (TYLENOL) tablet 650 mg  650 mg Oral Q4H PRN [place] Bing, MD   650 mg at 11/18/16 1818  . coconut oil  1 application Topical PRN [place] Bing, MD      . witch hazel-glycerin (TUCKS) pad 1 application  1 application Topical PRN [place] Bing, MD       And  . dibucaine (NUPERCAINAL) 1 % rectal ointment 1 application  1 application Rectal PRN [place] Bing, MD      . diphenhydrAMINE (BENADRYL) injection 12.5 mg  12.5 mg Intravenous Q4H PRN Lowella Curb, MD       Or  . diphenhydrAMINE (BENADRYL) capsule 25 mg  25 mg Oral Q4H PRN Lowella Curb, MD      . diphenhydrAMINE (BENADRYL) capsule 25 mg  25 mg Oral Q6H PRN [place] Bing, MD      . ibuprofen (ADVIL,MOTRIN) tablet 600  mg  600 mg Oral Q6H PRN [place] Bing, MD   600 mg at 11/19/16 0714  . lactated ringers infusion   Intravenous Continuous Lowella Curb, MD      . lactated ringers infusion   Intravenous Continuous [place] Bing, MD 125 mL/hr at 11/18/16 0217    . menthol-cetylpyridinium (CEPACOL) lozenge 3 mg  1 lozenge Oral Q2H PRN [place] Bing, MD      . nalbuphine (NUBAIN) injection 5 mg  5 mg Intravenous Q4H PRN Lowella Curb, MD       Or  . nalbuphine (NUBAIN) injection 5 mg  5 mg Subcutaneous Q4H PRN Lowella Curb, MD      . nalbuphine (NUBAIN) injection 5 mg  5 mg Intravenous Once PRN Lowella Curb, MD       Or  . nalbuphine (NUBAIN) injection 5 mg  5 mg Subcutaneous Once PRN Lowella Curb, MD      . naloxone [facility]) 2 mg in dextrose 5 % 250 mL infusion  1-4 mcg/kg/hr Intravenous Continuous PRN Lowella Curb, MD      . naloxone [facility]) injection 0.4 mg  0.4 mg Intravenous PRN Lowella Curb, MD  And  . sodium chloride flush (NS) 0.9 % injection 3 mL  3 mL Intravenous PRN Lowella CurbMiller, Warren Ray, MD      . ondansetron [facility](ZOFRAN) injection 4 mg  4 mg Intravenous Q8H PRN Lowella CurbMiller, Warren Ray, MD      . oxyCODONE (Oxy IR/ROXICODONE) immediate release tablet 10 mg  10 mg Oral Q4H PRN [place] BingPickens, Charlie, MD   10 mg at 11/18/16 0542  . oxyCODONE (Oxy IR/ROXICODONE) immediate release tablet 5 mg  5 mg Oral Q4H PRN [place] BingPickens, Charlie, MD   5 mg at 11/19/16 0406  . polyethylene glycol (MIRALAX / GLYCOLAX) packet 17 g  17 g Oral Daily [place] BingPickens, Charlie, MD   17 g at 11/18/16 1234  . prenatal multivitamin tablet 1 tablet  1 tablet Oral Q1200 [place] BingPickens, Charlie, MD   1 tablet at 11/18/16 1235  . scopolamine (TRANSDERM-SCOP) 1 MG/3DAYS 1.5 mg  1 patch Transdermal Once Lowella CurbMiller, Warren Ray, MD      . senna-docusate (Senokot-S) tablet 1 tablet  1 tablet Oral QHS PRN [place] BingPickens, Charlie, MD      . simethicone (MYLICON) chewable tablet 80 mg  80 mg Oral TID [facility] Pickens, Charlie, MD   80 mg at  11/19/16 0738  . sodium citrate-citric acid (ORACIT) solution 30 mL  30 mL Oral Once Lowella CurbMiller, Warren Ray, MD      . Tdap (BOOSTRIX) injection 0.5 mL  0.5 mL Intramuscular Once [place] BingPickens, Charlie, MD       Labs:   Recent Labs Lab 11/15/16 1503 11/18/16 0530  WBC 6.4 11.0*  HGB 10.9* 10.2*  HCT 33.1* 30.5*  PLT 193 174     Assessment & Plan:  Pt doing well *Postpartum/postop: Breastfeeding.Undecided contraception. Pt states newborn is doing okay. Continue routine care.  *Disposition: Discharge on POD#3 as per her request  O POS / Rubella Immune / RPR ordered / HIV ordered / HepBsAg negative / Tdap UTD: ordered/ pap ASCUS, HPV pos / pumping  / Contraception: undecided / Circ: not applicable/ Follow up: [facility]   Jaynie CollinsUgonna Yasenia Reedy, MD Attending, [facility](Faculty Practice)

## 2016-11-19 NOTE — Lactation Note (Signed)
This note was copied from a baby's chart. Lactation Consultation Note  Patient Name: Boy Lennox GrumblesMiyesha Aikey ONGEX'BToday's Date: 11/19/2016 Reason for consult: Follow-up assessment   With this mom of a NICU baby, now 1053 hours old, and 26 weeks CGA, weight just ove 2 pounds. The baby is on HFNC and room air. Mom told me the NICU staff thinks her baby may be farther along gestation than 26, since christian is doing so well.  Mom's breast very full, with knots of milk, tender and warm. Mom has been hand expressing but not pumping/ I increased her to 27 flanges, with a good fit, and massaged her breast while she pumped, and she express 90 ml's of transitional milk. Mom feels much better, softer, still with some hard ares around her armpits. Mom also given ice, which feels very good to mom. Mom advised to pump at least every 3 hours, or more if full, and pump in the maintenance setting, 15-30 minutes. . Mom thrilled to see how much milk she has. I did a WIC loaner for her to take home at [employer] tomorrow. Mom knows to call for lactation with questions/conerns.    Maternal Data    Feeding Feeding Type: Donor Breast Milk (and EBM) Length of feed: 30 min  LATCH Score/Interventions          Comfort (Breast/Nipple): Filling, red/small blisters or bruises, mild/mod discomfort Problem noted: Engorgment Intervention(s): Ice;Hand expression  Problem noted: Filling Interventions (Filling): Hand pump;Double electric pump        Lactation Tools Discussed/Used     Consult Status Consult Status: PRN Follow-up type: In-patient (NICU)    Alfred LevinsLee, Mitzie Marlar Anne 11/19/2016, 4:11 PM

## 2016-11-20 DIAGNOSIS — Z98891 History of uterine scar from previous surgery: Secondary | ICD-10-CM

## 2016-11-20 DIAGNOSIS — O321XX0 Maternal care for breech presentation, not applicable or unspecified: Secondary | ICD-10-CM | POA: Diagnosis not present

## 2016-11-20 MED ORDER — OXYCODONE HCL 5 MG PO TABS: 5.0000 mg | ORAL_TABLET | ORAL | 0 refills | Status: DC | PRN

## 2016-11-20 MED ORDER — SENNOSIDES-DOCUSATE SODIUM 8.6-50 MG PO TABS: 1.0000 | ORAL_TABLET | Freq: Every day | ORAL | 1 refills | Status: DC

## 2016-11-20 MED ORDER — IBUPROFEN 600 MG PO TABS: 600.0000 mg | ORAL_TABLET | Freq: Four times a day (QID) | ORAL | 0 refills | Status: DC | PRN

## 2016-11-20 NOTE — Progress Notes (Signed)
Pt verbalizes understanding of d/c instructions, medications, follow up appts, when to seek medical attention, & belongings policy. Pt has no questions at this time. Pt was given a copy of d/c instructions, as well as her prescriptions. Female visitor with patient at time of d/c. Pt walking to NICU prior to leaving hospital. Sheryn BisonGordon, Smriti Barkow Warner

## 2016-11-20 NOTE — Plan of Care (Signed)
Problem: Activity: Goal: Will verbalize the importance of balancing activity with adequate rest periods Outcome: Completed/Met Date Met: 11/20/16 Pt ambulates well to NICU and back and then takes rest in her bed. Pt has appropriate balance of activity    Problem: Coping: Goal: Ability to cope will improve Outcome: Completed/Met Date Met: 11/20/16 Pt is emotionally appropriate considering infant in NICU  Goal: Ability to identify and utilize available resources and services will improve Outcome: Completed/Met Date Met: 11/20/16 LC has seen patient, pt was advised particularly about breastfeeding support/outpatient appts.  Problem: Life Cycle: Goal: Risk for postpartum hemorrhage will decrease Outcome: Completed/Met Date Met: 11/20/16 Bleeding is minimal and appropriate, pt understands when to notify MD of bleeding &/or be seen.   Problem: Nutritional: Goal: Mothers verbalization of comfort with breastfeeding process will improve Outcome: Progressing Mother understands pumping. LC has worked with patient for the last couple of days.   Problem: Role Relationship: Goal: Ability to demonstrate positive interaction with newborn will improve Outcome: Completed/Met Date Met: 11/20/16 I have not witnessed interaction with baby in NICU, but pt visits frequently.  Problem: Skin Integrity: Goal: Demonstration of wound healing without infection will improve Outcome: Completed/Met Date Met: 11/20/16 Dressing is C,D,I at time of d/c. Pt understands how to care for incision and signs of infection.

## 2016-11-20 NOTE — Lactation Note (Signed)
This note was copied from a baby's chart. Lactation Consultation Note  Patient Name: Tamara Lennox GrumblesMiyesha Waldo RUEAV'WToday's Date: 11/20/2016   Day of discharge for Mom, baby 73 hrs.  Mom pumping with DEBP, obtained 4-5 oz.  Breasts full but compressible, and some full ducts palpated near right axilla.  Mom states that her breasts feel much better now after pumping.  Mom did NOT pump during the night last night.  Explained that she should set her alarm for at least once, preferable twice during the night to avoid engorgement.  Talked about the benefits of frequent pumping with regards to her milk supply.  Warm, moist compresses right before pumping, and massage over full ducts, and ice packs for 20 minutes between pumping will help with engorgement.   Instructed Mom to report any fever over 100 to her OB doctor.   Instructed Mom to pump 8-12 times per 24 hrs.  Mom has her pump parts and knows to take them to NICU when she sees her baby.  Mom aware of pumping rooms.   Mom to call prn for any assistance, and Lactation to follow up with her in the NICU when visiting her baby.   Praised Mom for providing her breastmilk for her baby.   [name], [name] E 11/20/2016, 12:22 PM

## 2016-11-20 NOTE — Discharge Summary (Signed)
OB Discharge Summary     Patient Name: Tamara Cowan DOB: 25-Jun-1991 MRN: [id number]  Date of admission: 11/15/2016 Delivering MD: [place] Bing   Date of discharge: 11/20/2016  Admitting diagnosis: 25wks bleeding, occasional cramps Intrauterine pregnancy: [redacted]w[redacted]d     Secondary diagnosis:  Active Problems:   Threatened preterm labor, second trimester   Status post C-section   Breech presentation at birth  incompetent cervix Breech presentation   Additional problems: none     Discharge diagnosis: Preterm Pregnancy Delivered                                                                                                Post partum procedures:none  Augmentation: none  Complications: None  Hospital course:  Sceduled C/S   26 y.o. yo G3P0211 at [redacted]w[redacted]d was admitted to the hospital 11/15/2016 for vaginal bleeding after intercourse. She note that after intercourse her cerclage had fallen out. She was noted to have bulging membranes in the vagina. Patient was observed and on day of c-section was having some abdominal pain and found to be dilating.  Cesarean section  Was preformed with the following indication:Malpresentation and extreme prematurity.  Membrane Rupture Time/Date: 10:40 AM ,11/17/2016   Patient delivered a Viable infant.11/17/2016  Details of operation can be found in separate operative note.  Pateint had an uncomplicated postpartum course.  She is ambulating, tolerating a regular diet, passing flatus, and urinating well. Patient is discharged home in stable condition on  11/20/16         Physical exam  Vitals:   11/19/16 0739 11/19/16 2100 11/20/16 0822 11/20/16 0851  BP: 126/80 123/65 (!) 129/91 137/88  Pulse: 82 (!) 103 79   Resp: 17 18 18    Temp: 98.9 F (37.2 C) 98.7 F (37.1 C) 98.2 F (36.8 C)   TempSrc: Oral Oral Oral   SpO2: 100% 100% 100%   Weight:      Height:       General: alert, cooperative and no distress Lochia: appropriate Uterine Fundus:  firm Incision: Healing well with no significant drainage DVT Evaluation: No evidence of DVT seen on physical exam. Labs: Lab Results  Component Value Date   WBC 11.0 (H) 11/18/2016   HGB 10.2 (L) 11/18/2016   HCT 30.5 (L) 11/18/2016   MCV 85.7 11/18/2016   PLT 174 11/18/2016   CMP Latest Ref Rng & Units 06/02/2016  Glucose 65 - 99 mg/dL 782(N)  BUN 6 - 20 mg/dL 6  Creatinine 5.62 - 1.30 mg/dL 8.65  Sodium 784 - 696 mmol/L 142  Potassium 3.5 - 5.1 mmol/L 3.1(L)  Chloride 101 - 111 mmol/L 103    Discharge instruction: per After Visit Summary and "Baby and Me Booklet".  After visit meds:  Allergies as of 11/20/2016   No Known Allergies     Medication List    STOP taking these medications   miconazole 2 % vaginal cream Commonly known as:  MONISTAT 7   oxyCODONE-acetaminophen 5-325 MG tablet Commonly known as:  PERCOCET/ROXICET     TAKE these medications   ibuprofen 600 MG tablet Commonly  known as:  ADVIL,MOTRIN Take 1 tablet (600 mg total) by mouth every 6 (six) hours as needed for mild pain or cramping.   oxyCODONE 5 MG immediate release tablet Commonly known as:  Oxy IR/ROXICODONE Take 1 tablet (5 mg total) by mouth every 4 (four) hours as needed (pain scale 4-7).   prenatal multivitamin Tabs tablet Take 1 tablet by mouth daily.   senna-docusate 8.6-50 MG tablet Commonly known as:  Senokot-S Take 1 tablet by mouth daily.       Diet: routine diet  Activity: Advance as tolerated. Pelvic rest for 6 weeks.   Outpatient follow up:6 weeks Follow up Appt: Future Appointments Date Time Provider Department Center  12/28/2016 2:40 PM Lorne SkeensSchenk, Nicholas Michael, MD [facility]   Follow up Visit:No Follow-up on file.  Postpartum contraception: Undecided  Newborn Data: Live born female  Birth Weight: 2 lb 1.9 oz (960 g) APGAR: 4, 9  Baby Feeding: Bottle and Breast Disposition:NICU   11/20/2016 Ernestina PennaNicholas Schenk, MD

## 2016-11-20 NOTE — Discharge Instructions (Signed)

## 2016-11-22 ENCOUNTER — Ambulatory Visit (HOSPITAL_COMMUNITY): Payer: Medicaid Other

## 2016-11-29 ENCOUNTER — Ambulatory Visit: Payer: Self-pay

## 2016-11-29 NOTE — Lactation Note (Signed)
This note was copied from a baby's chart. Lactation Consultation Note  Patient Name: Tamara Lennox GrumblesMiyesha Waldridge WNUUV'OToday's Date: 11/29/2016 Reason for consult: Follow-up assessment;NICU baby;Infant < 6lbs   Follow up with mom of NICU infant at 1712 days old. Mom reports pumping is going well. She reports her breasts are not as hard as they were, discussed lessening of swelling that allows for some softening after the first week post partum. Mom is pumping at least 4 oz./ pumping. Mom concerned one breast produces less than the other, discussed this is a normal variation. Mom without further questions/concerns at this time.    Maternal Data    Feeding    LATCH Score/Interventions                      Lactation Tools Discussed/Used     Consult Status Consult Status: PRN Follow-up type: Call as needed    Ed BlalockSharon S Aerial Dilley 11/29/2016, 1:18 PM

## 2016-12-01 ENCOUNTER — Encounter: Payer: Medicaid Other | Admitting: Family Medicine

## 2016-12-10 NOTE — Addendum Note (Signed)
Addendum  created 12/10/16 1047 by Leilani AbleHatchett, Shahab Polhamus, MD   Sign clinical note

## 2016-12-20 NOTE — Telephone Encounter (Signed)
Opened in error

## 2016-12-28 ENCOUNTER — Ambulatory Visit (INDEPENDENT_AMBULATORY_CARE_PROVIDER_SITE_OTHER): Payer: Medicaid Other | Admitting: Obstetrics and Gynecology

## 2016-12-28 ENCOUNTER — Encounter: Payer: Self-pay | Admitting: Obstetrics and Gynecology

## 2016-12-28 NOTE — Patient Instructions (Signed)
Human Papillomavirus Human papillomavirus (HPV) is the most common sexually transmitted infection (STI). It is easy to pass it from person to person (contagious). HPV can cause cervical cancer, anal cancer, and genital warts. The genital warts can be seen and felt. Also, there may be wartlike regions in the throat. HPV may not have any symptoms. It is possible to have HPV for a long time and not know it. You may pass HPV on to others without knowing it. Follow these instructions at home:  Take medicines as told by your doctor.  Use over-the-counter creams for itching as told by your doctor.  Keep all follow-up visits. Make sure to get Pap tests as told by your doctor.  Do not touch or scratch the warts.  Do not treat genital warts with medicines used for treating hand warts.  Do not have sex while you are getting treatment.  Do not douche or use tampons during treatment of HPV.  Tell your sex partner about your infection because he or she may also need treatment.  If you get pregnant, tell your doctor that you had HPV. Your doctor will watch your pregnancy closely. This is important to keep your baby safe.  After treatment, use condoms during sex to prevent future infections.  Have only one sex partner.  Have a sex partner who does not have other sex partners. Contact a doctor if:  The treated skin is red, swollen, or painful.  You have a fever.  You feel ill.  You feel lumps or pimple-like areas in and around your genital area.  You have bleeding of the vagina or the area that was treated.  You have pain during sex. This information is not intended to replace advice given to you by your health care provider. Make sure you discuss any questions you have with your health care provider. Document Released: 06/03/2008 Document Revised: 11/27/2015 Document Reviewed: 09/26/2013 Elsevier Interactive Patient Education  2017 Elsevier Inc.  

## 2016-12-28 NOTE — Progress Notes (Signed)
Subjective:     Tamara Cowan is a 26 y.o. female who presents for a postpartum visit. She is 5 weeks postpartum following a low cervical transverse Cesarean section. I have fully reviewed the prenatal and intrapartum course. The delivery was at 25.5 gestational weeks. Outcome: primary cesarean section, low transverse incision. Anesthesia: spinal. Postpartum course has been unremarkable. Baby's course has been unremarkable. Baby is feeding by bottle - [place]. Bleeding no bleeding. Bowel function is normal. Bladder function is normal. Patient is sexually active. Contraception method is none. Postpartum depression screening: negative.  The following portions of the patient's history were reviewed and updated as appropriate: allergies, current medications, past family history, past medical history, past social history, past surgical history and problem list.  Review of Systems Pertinent items are noted in HPI.   Objective:    BP (!) 139/101   Pulse 79   Ht 5\' 7"  (1.702 m)   Wt 153 lb 12.8 oz (69.8 kg)   BMI 24.09 kg/m   General:  alert, cooperative and appears stated age   Breasts:  defered in lactating female  Lungs: clear to auscultation bilaterally  Heart:  regular rate and rhythm, S1, S2 normal, no murmur, click, rub or gallop  Abdomen: soft, non-tender; bowel sounds normal; no masses,  no organomegaly well healing c-section scar   Vulva:  not evaluated  Vagina: not evaluated  Cervix:  not evaluated  Corpus: not examined  Adnexa:  not evaluated  Rectal Exam: Not performed.        Assessment:     Normal postpartum exam. Pap smear not done at today's visit.   Plan:    1. Contraception: none 2. ASCUS HPV+ pap in Feb - no colposcopy done scheduled today as patient is >25 3. Follow up in: 4 week or as needed.   4. Patient declines birth control. Counseled on need protection and possibility of becoming pregnant as she stopped breast feeding this week.

## 2017-01-10 ENCOUNTER — Ambulatory Visit: Payer: Self-pay

## 2017-01-10 NOTE — Lactation Note (Signed)
This note was copied from a baby's chart. Lactation Consultation Note  Patient Name: Tamara Cowan VHQIO'NToday's Date: 01/10/2017   Per RN, mom is no longer pumping. Infant is receiving donor breast milk.      Maternal Data    Feeding    LATCH Score/Interventions                      Lactation Tools Discussed/Used     Consult Status      Ed BlalockSharon S Channa Hazelett 01/10/2017, 2:35 PM

## 2017-01-27 ENCOUNTER — Ambulatory Visit: Payer: Medicaid Other | Admitting: Obstetrics & Gynecology

## 2017-03-09 ENCOUNTER — Ambulatory Visit: Payer: Medicaid Other | Admitting: Obstetrics & Gynecology

## 2017-03-23 ENCOUNTER — Ambulatory Visit: Payer: Medicaid Other | Admitting: Obstetrics & Gynecology

## 2017-05-16 ENCOUNTER — Ambulatory Visit: Payer: Self-pay | Admitting: Obstetrics & Gynecology

## 2017-06-06 ENCOUNTER — Ambulatory Visit: Payer: Self-pay | Admitting: Obstetrics & Gynecology

## 2017-10-12 IMAGING — US US OB COMP LESS 14 WK
1 series · 15 of 28 positions shown · non-contrast
Comparison: None.

CLINICAL DATA: Bleeding since this morning.  Patient is pregnant.

EXAM:
OBSTETRIC <14 WK US AND TRANSVAGINAL OB US
TECHNIQUE: Both transabdominal and transvaginal ultrasound examinations were
performed for complete evaluation of the gestation as well as the
maternal uterus, adnexal regions, and pelvic cul-de-sac.
Transvaginal technique was performed to assess early pregnancy.

[Series 1: us ob comp less 14 wk · 15 of 63 slices shown]
[im 1/63]
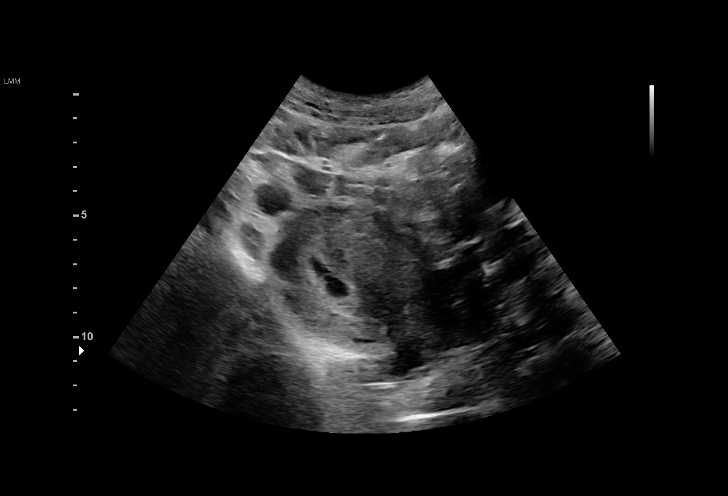
[im 5/63]
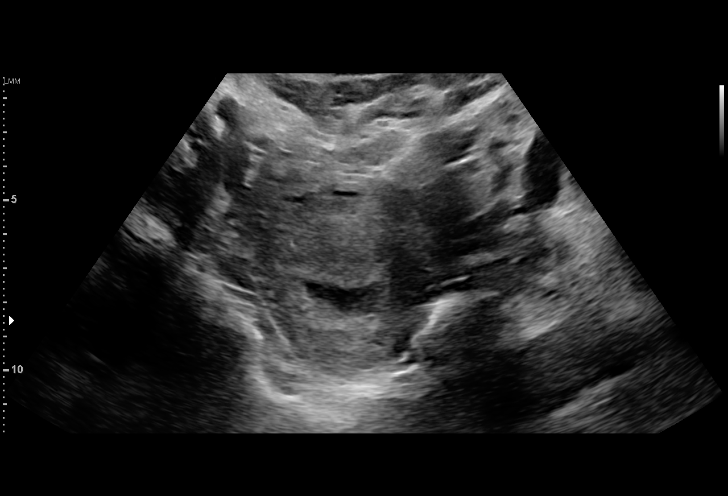
[im 10/63]
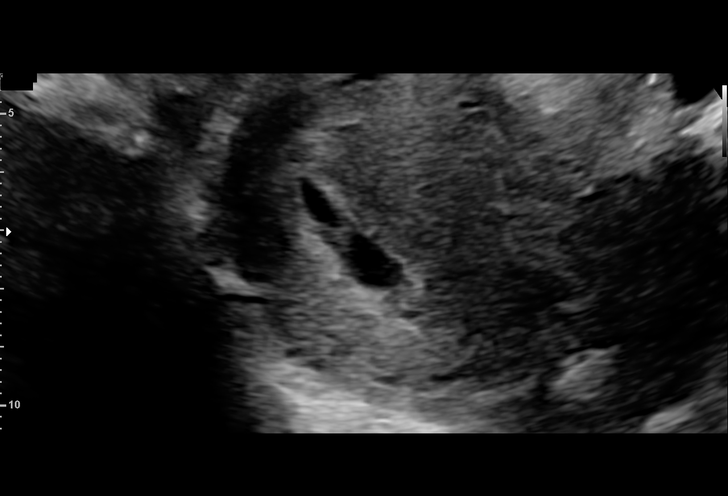
[im 14/63]
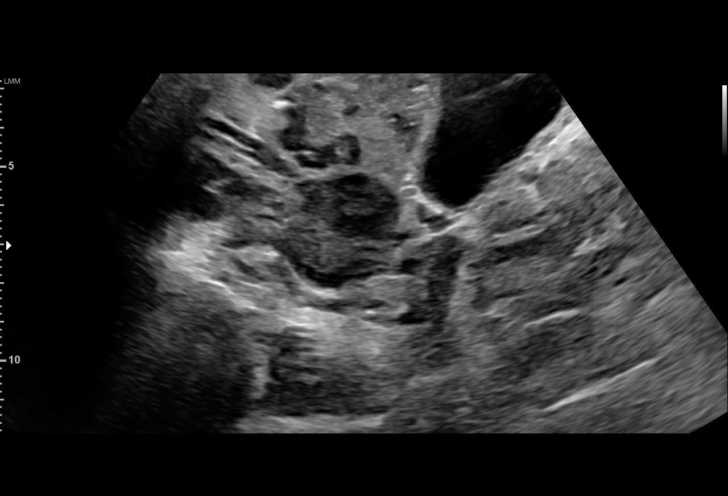
[im 19/63]
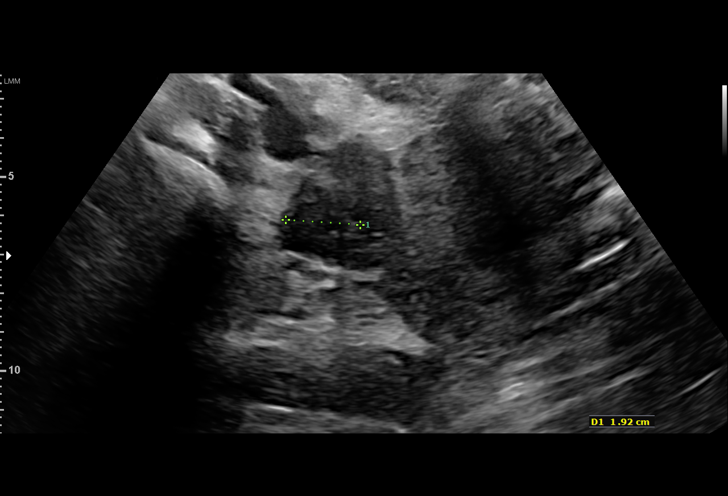
[im 23/63]
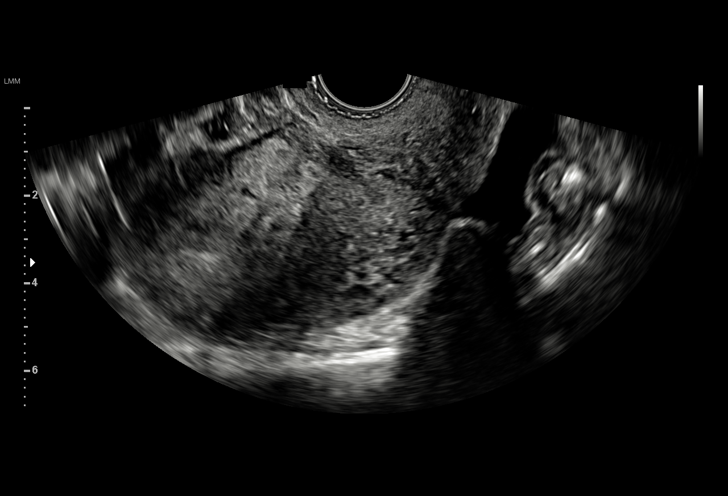
[im 28/63]
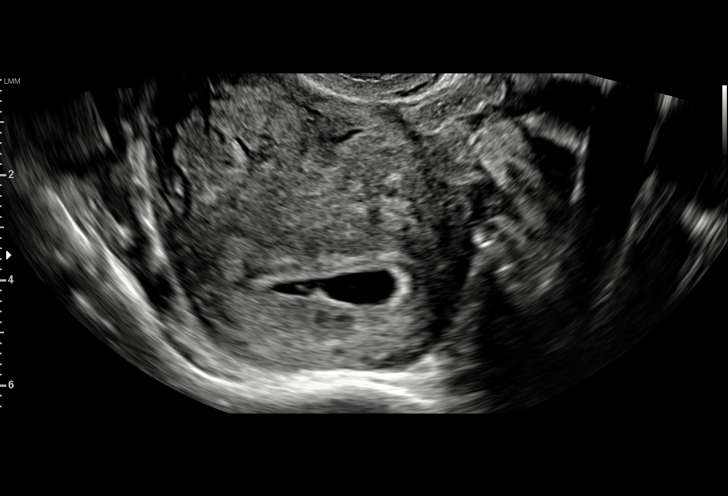
[im 33/63]
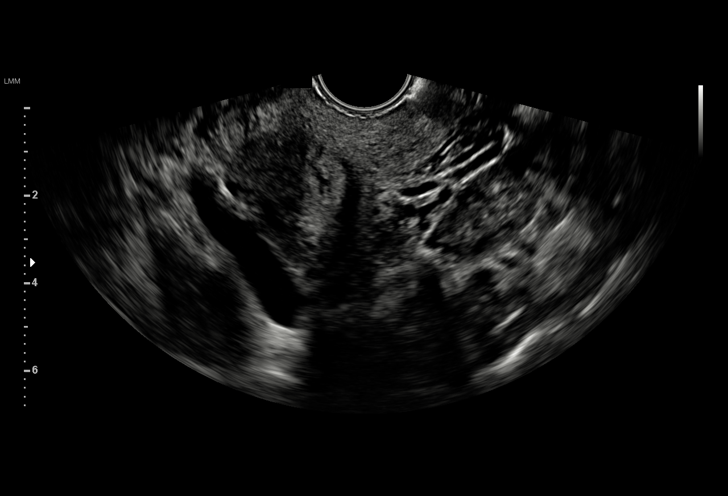
[im 35/63]
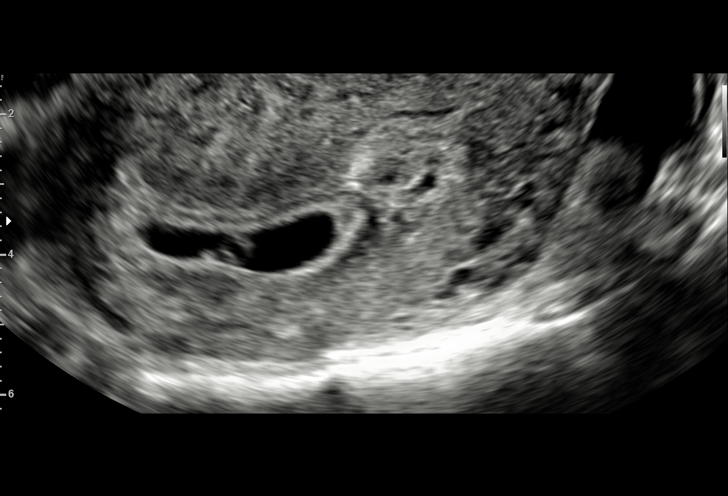
[im 40/63]
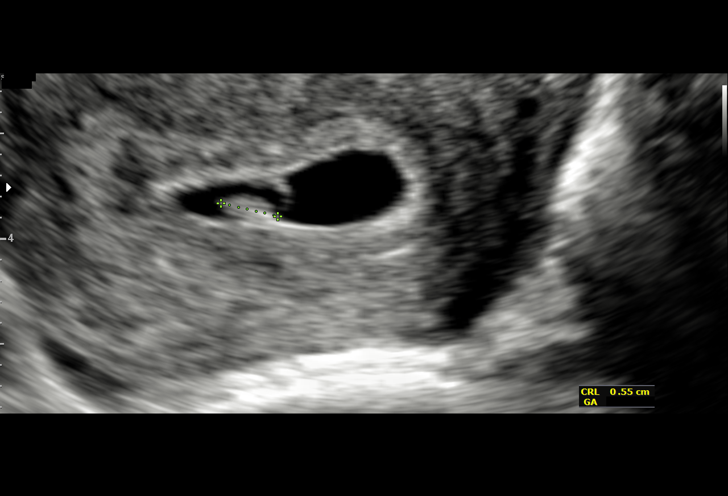
[im 44/63]
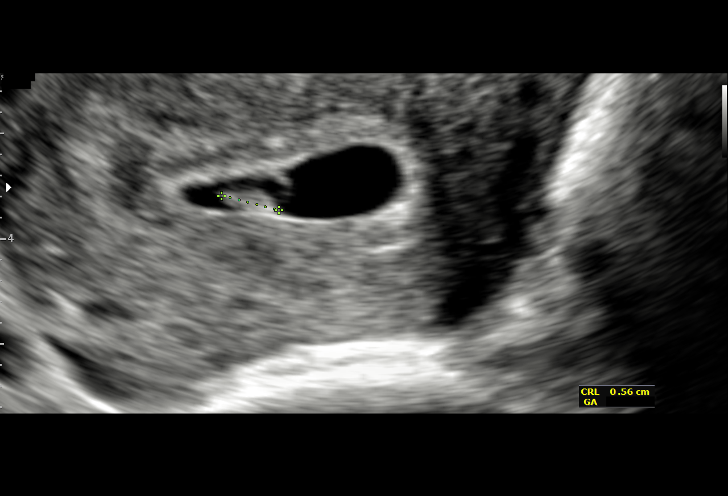
[im 49/63]
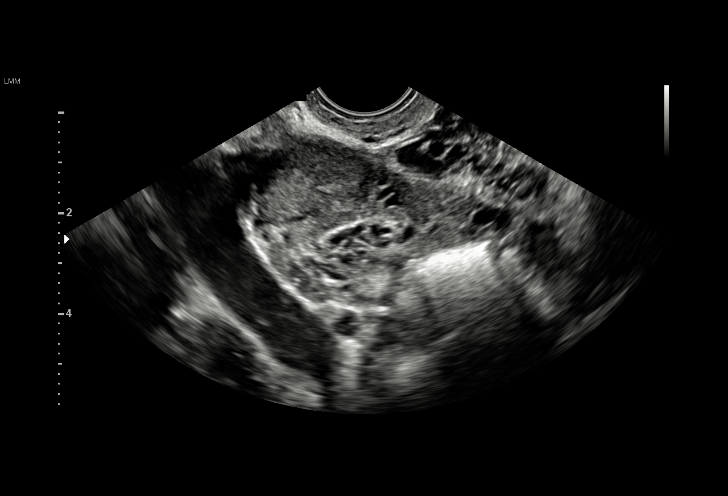
[im 53/63]
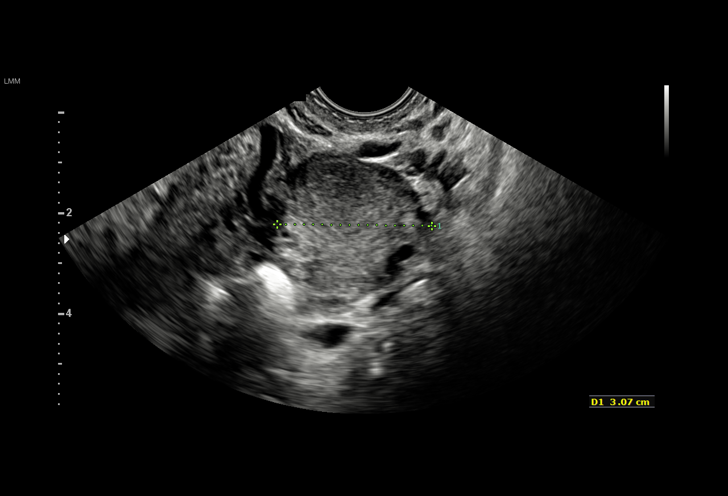
[im 58/63]
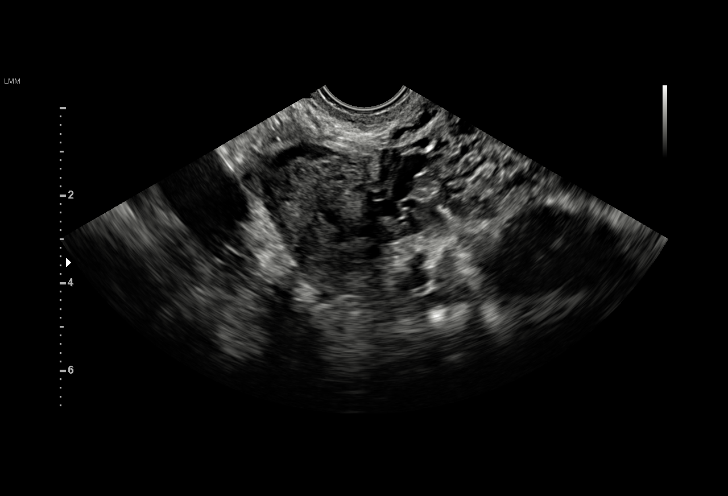
[im 63/63]
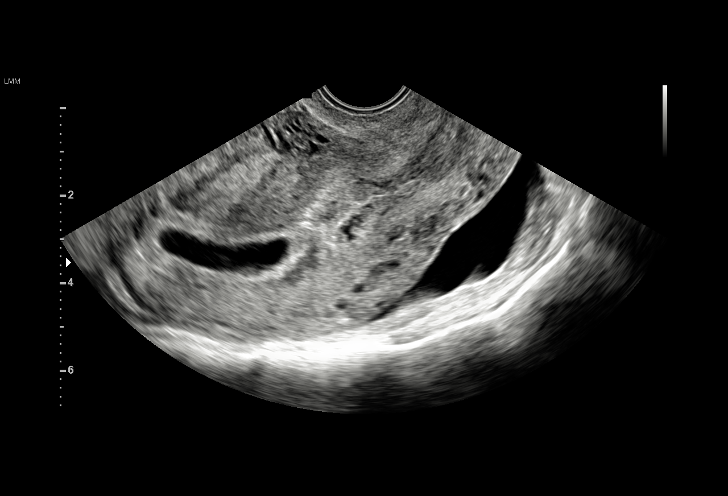

[15 of 28 positions shown; findings below may reference images not displayed]

FINDINGS: Intrauterine gestational sac: Single

Yolk sac:  Present

Embryo:  Present

Cardiac Activity: Present

Heart Rate: 121  bpm

MSD:   mm    w     d

CRL:  5.6  mm   6 w   2 d                  US EDC: February 25, 2017

Subchorionic hemorrhage:  None visualized.

Maternal uterus/adnexae: The ovaries are normal. There is a small
amount of free fluid.
IMPRESSION: Single live intrauterine gestation measuring to 6 weeks and 2 days.

## 2018-01-16 IMAGING — US US MFM OB TRANSVAGINAL
1 series · 15 of 28 positions shown · non-contrast
Comparison: none

[Series 1: us mfm ob transvaginal · 15 of 47 slices shown]
[im 1/47]
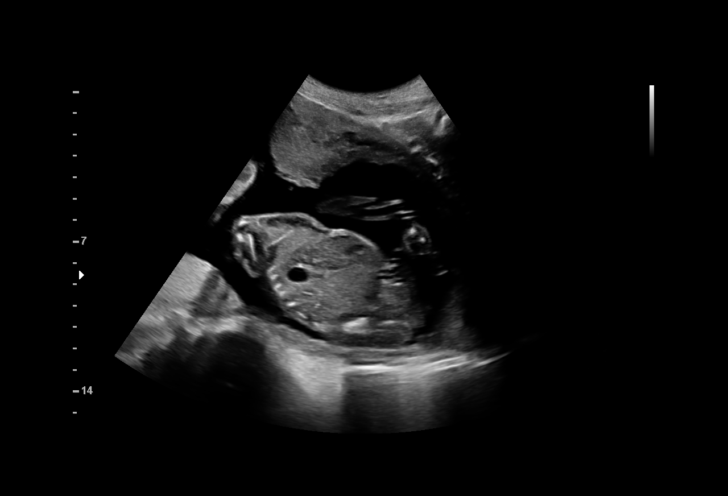
[im 4/47]
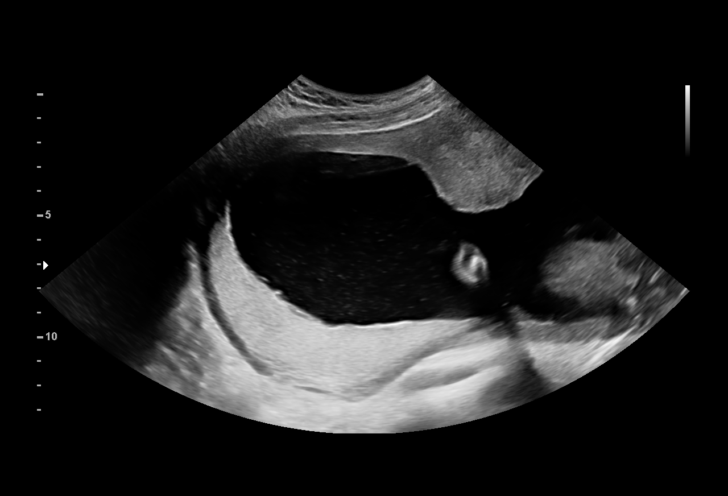
[im 7/47]
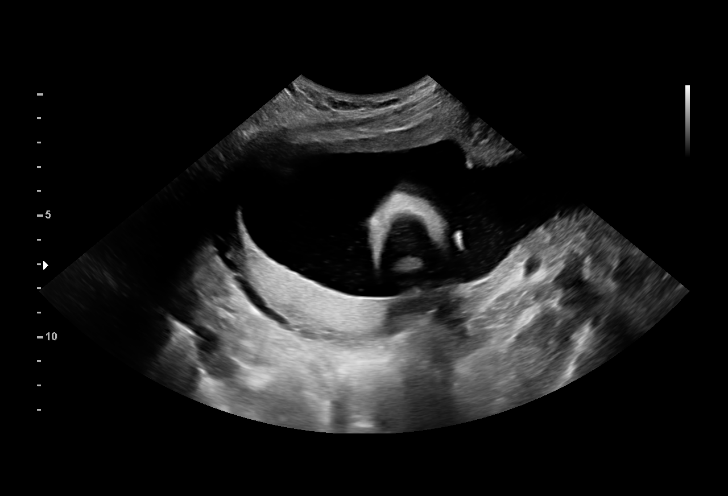
[im 11/47]
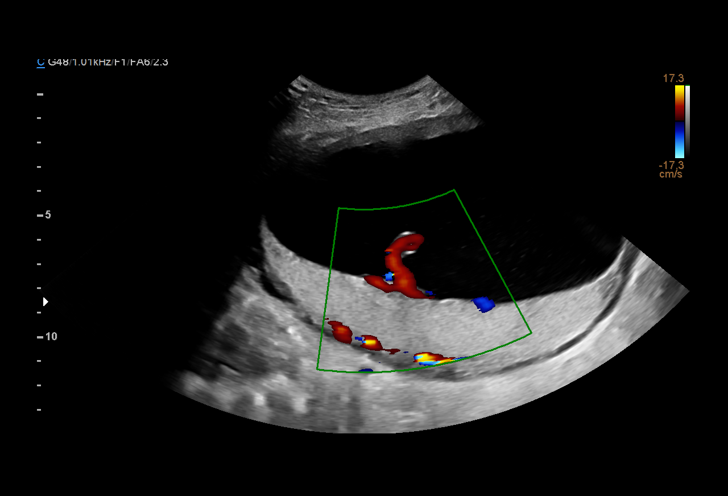
[im 14/47]
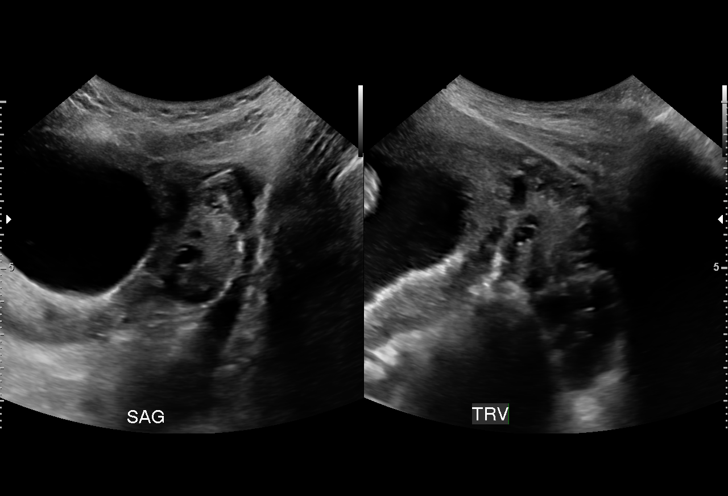
[im 18/47]
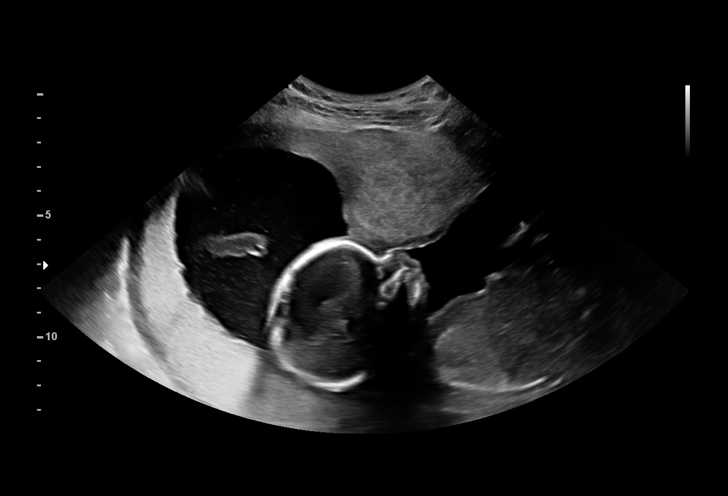
[im 21/47]
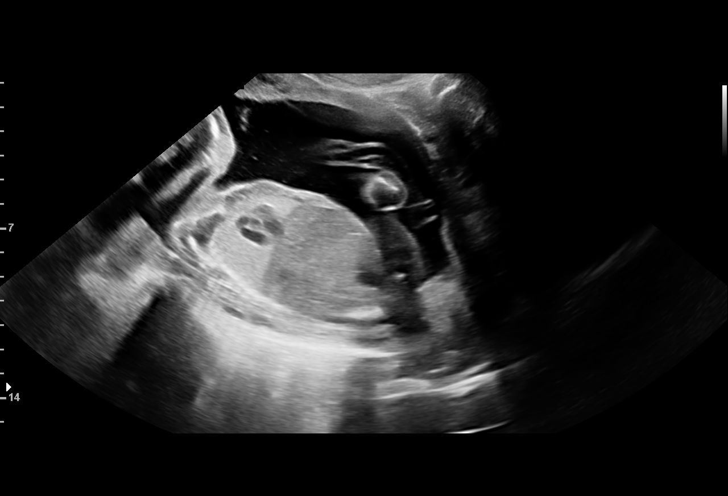
[im 24/47]
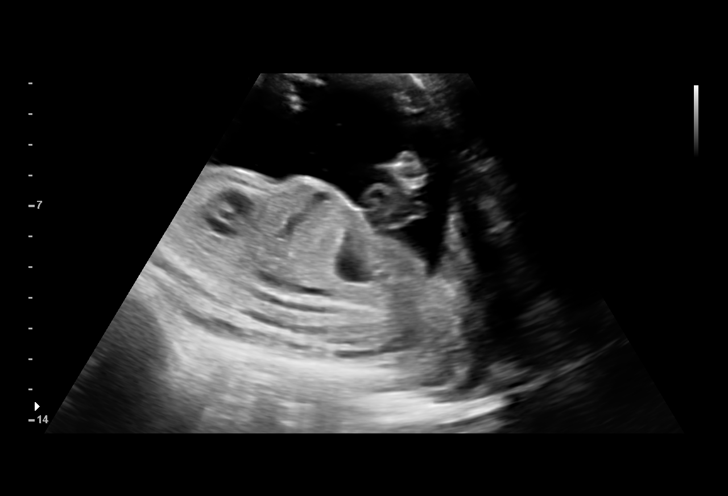
[im 26/47]
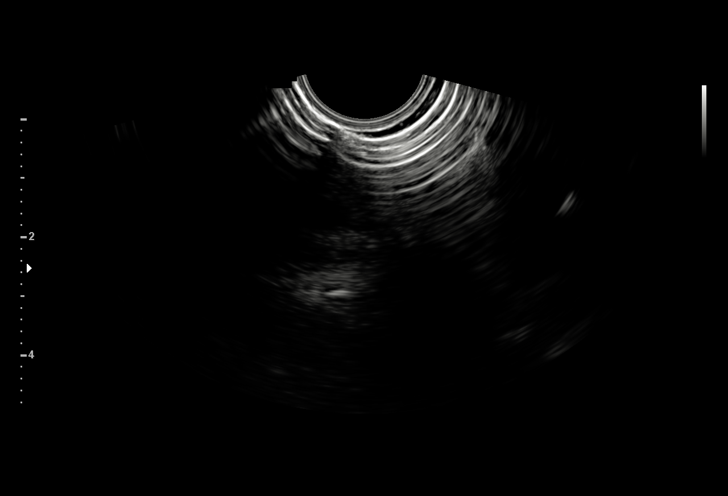
[im 29/47]
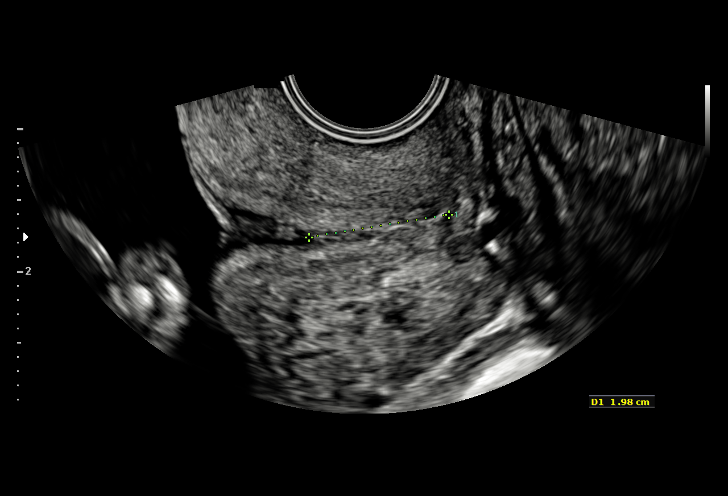
[im 33/47]
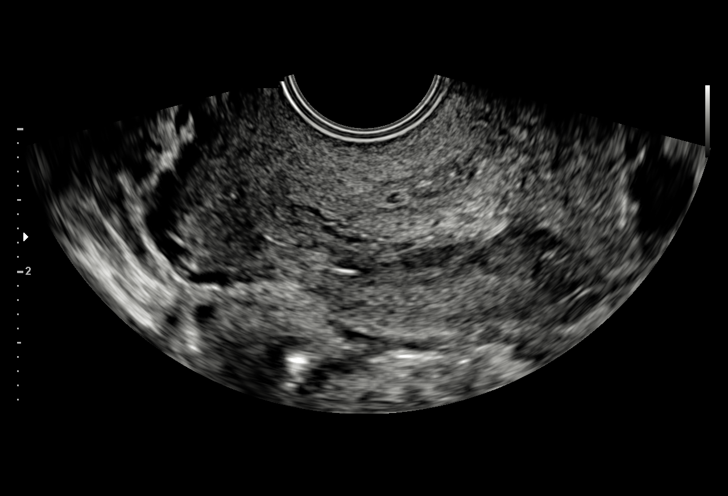
[im 36/47]
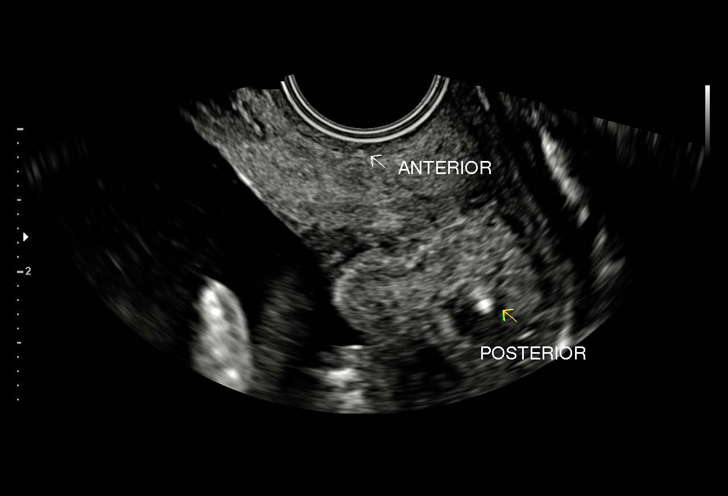
[im 40/47]
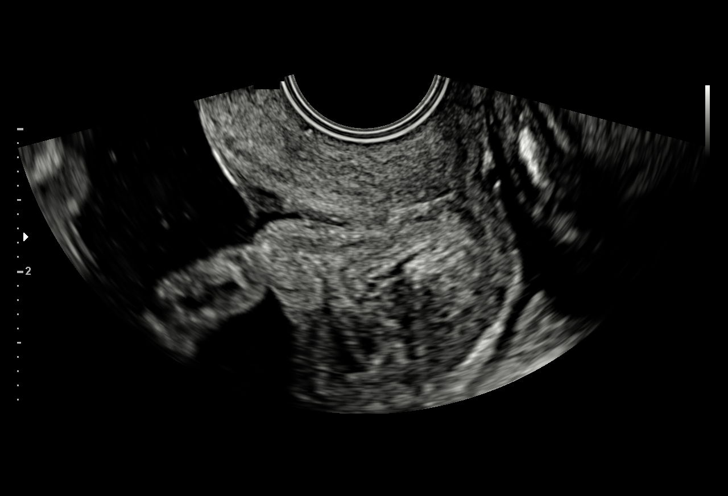
[im 43/47]
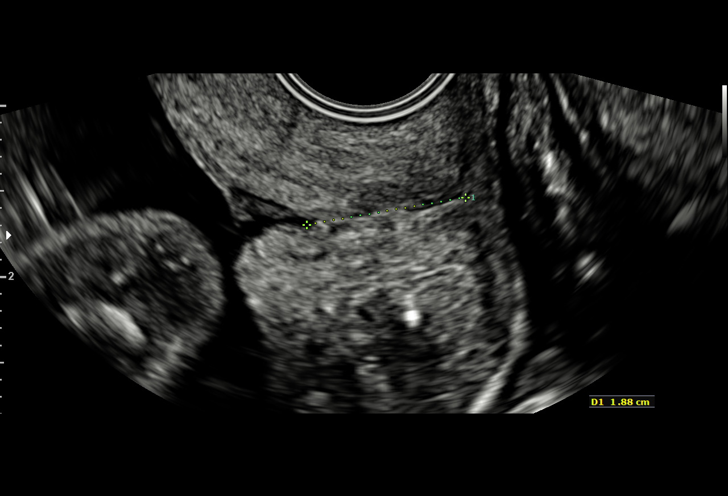
[im 47/47]
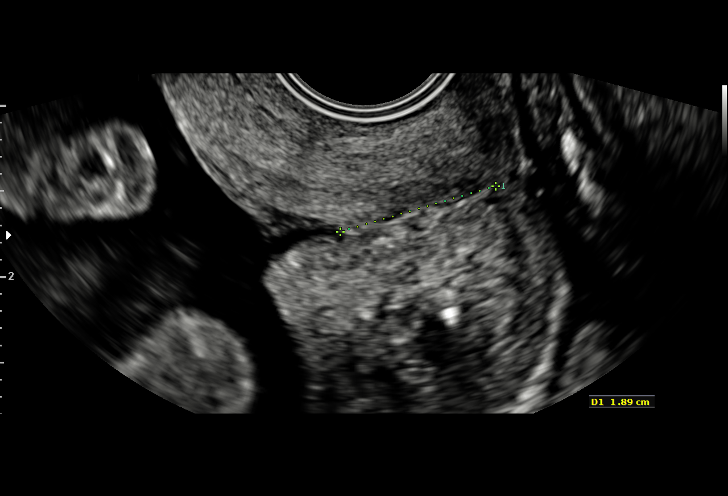

[15 of 28 positions shown; findings below may reference images not displayed]

OB/Gyn Clinic
GORDILLO NP

1  SUNITHA ADORNO            [id number]      [id number]     [id number]
Indications

20 weeks gestation of pregnancy
Poor obstetric history: Previous midtrimester
loss (20 weeks)
Encounter for cervical length
Cervical cerclage suture present, second
trimester
Cervical insufficiency, 2nd
OB History

Gravidity:    3          SAB:   1
TOP:          1        Living:  0
Fetal Evaluation

Num Of Fetuses:     1
Fetal Heart         161
Rate(bpm):
Cardiac Activity:   Observed
Presentation:       Breech
Placenta:           Posterior, above cervical os
P. Cord Insertion:  Visualized
Amniotic Fluid
AFI FV:      Subjectively within normal limits

Largest Pocket(cm)
7.2
Gestational Age

LMP:           26w 0d       Date:   04/09/16                 EDD:   01/14/17
Best:          20w 0d    Det. By:   Early Ultrasound         EDD:   02/25/17
(07/04/16)
Cervix Uterus Adnexa

Cervix
Length:            1.7  cm.
Cerclage visualized.

Uterus
No abnormality visualized.

Left Ovary
Within normal limits.

Right Ovary
Within normal limits.
Impression

SIUP at 20+0 weeks
Normal amniotic fluid volume
EV views of cervix: mild funneling with distal closed portion
measuring 1.7 - 2.1 cms; suture visualized and appeared to
be intact
Recommendations

CL and growth in 2 weeks
CL in 4 weeks

## 2018-02-18 ENCOUNTER — Encounter (HOSPITAL_COMMUNITY): Payer: Self-pay | Admitting: Emergency Medicine

## 2018-02-18 ENCOUNTER — Emergency Department (HOSPITAL_COMMUNITY)
Admission: EM | Admit: 2018-02-18 | Discharge: 2018-02-18 | Disposition: A | Discharge status: Home / Self Care | Payer: 59 | Attending: Emergency Medicine | Admitting: Emergency Medicine

## 2018-02-18 DIAGNOSIS — B9689 Other specified bacterial agents as the cause of diseases classified elsewhere: Secondary | ICD-10-CM | POA: Diagnosis not present

## 2018-02-18 DIAGNOSIS — N898 Other specified noninflammatory disorders of vagina: Secondary | ICD-10-CM

## 2018-02-18 DIAGNOSIS — N760 Acute vaginitis: Secondary | ICD-10-CM | POA: Insufficient documentation

## 2018-02-18 DIAGNOSIS — B373 Candidiasis of vulva and vagina: Secondary | ICD-10-CM | POA: Diagnosis not present

## 2018-02-18 DIAGNOSIS — Z87891 Personal history of nicotine dependence: Secondary | ICD-10-CM | POA: Insufficient documentation

## 2018-02-18 DIAGNOSIS — B3731 Acute candidiasis of vulva and vagina: Secondary | ICD-10-CM

## 2018-02-18 LAB — URINALYSIS, ROUTINE W REFLEX MICROSCOPIC
BACTERIA UA: NONE SEEN
BILIRUBIN URINE: NEGATIVE
GLUCOSE, UA: NEGATIVE mg/dL
HGB URINE DIPSTICK: NEGATIVE
Ketones, ur: NEGATIVE mg/dL
NITRITE: NEGATIVE
PROTEIN: NEGATIVE mg/dL
Specific Gravity, Urine: 1.014 (ref 1.005–1.030)
pH: 7 (ref 5.0–8.0)

## 2018-02-18 LAB — WET PREP, GENITAL
SPERM: NONE SEEN
Trich, Wet Prep: NONE SEEN

## 2018-02-18 LAB — CBG MONITORING, ED: Glucose-Capillary: 125 mg/dL — ABNORMAL HIGH (ref 70–99)

## 2018-02-18 LAB — POC URINE PREG, ED: Preg Test, Ur: NEGATIVE

## 2018-02-18 MED ORDER — FLUCONAZOLE 150 MG PO TABS
150.0000 mg | ORAL_TABLET | Freq: Once | ORAL | Status: AC
  Administered 2018-02-18: 150 mg via ORAL
  Filled 2018-02-18: qty 1

## 2018-02-18 NOTE — ED Notes (Addendum)
No reply x2 at 17:34.

## 2018-02-18 NOTE — ED Notes (Signed)
Pt given urine cup in triage, unable to urinate at this time.

## 2018-02-18 NOTE — ED Notes (Signed)
Patient was called twice and did not answer.  She came back in and said she had stepped out for a minute.

## 2018-02-18 NOTE — Discharge Instructions (Addendum)
Follow up with women's clinic.  Safe sex practices.

## 2018-02-18 NOTE — ED Triage Notes (Signed)
Pt presents to ED for assessment because since her son was born 1 year ago, patient's boyfriend has been expressing that he feels like he's being "poked" by something during sexual intercourse.  Patient states she also has noticed increasing white creamy discharge "like a yeast infection".  Patient denies pain, denies bleeding.  Patient states she had her cervix sewn shut (cerclage) during her pregnancy due to being high risk, and is concerned some of the sutures are still in there.

## 2018-02-18 NOTE — ED Notes (Signed)
No reply x1 at 16:17.

## 2018-02-20 LAB — GC/CHLAMYDIA PROBE AMP (~~LOC~~) NOT AT ARMC
Chlamydia: NEGATIVE
NEISSERIA GONORRHEA: NEGATIVE

## 2018-02-21 NOTE — ED Provider Notes (Signed)
[facility] Provider Note   CSN: [id number] Arrival date & time: 02/18/18  1417     History   Chief Complaint Chief Complaint  Patient presents with  . Vaginal Discharge    HPI Lennox GrumblesMiyesha Cowan is a 27 y.o. female.  Patient presents with mild vaginal discharge and concerns from boyfriend that he is being poked during sexual intercourse.  Patient did have pregnancy and required cerclage in the past.  No fevers or chills.  No new sexual partners.  Patient denies STD history except HPV.  Patient has had Candida in the past     Past Medical History:  Diagnosis Date  . Preterm labor   . UTI (urinary tract infection) 2010   during pregnancy    Patient Active Problem List   Diagnosis Date Noted  . Status post C-section 11/20/2016  . Breech presentation at birth 11/20/2016  . Threatened preterm labor, second trimester 11/15/2016  . Cervical cerclage suture present in second trimester 10/20/2016  . 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 Supervision of high-risk pregnancy 09/08/2016  . ASCUS with positive high risk HPV cervical 09/08/2016    Past Surgical History:  Procedure Laterality Date  . CERVICAL CERCLAGE N/A 09/11/2016   Procedure: CERCLAGE CERVICAL;  Surgeon: Adam PhenixJames G Arnold, MD;  Location: [facility];  Service: Gynecology;  Laterality: N/A;  . CESAREAN SECTION N/A 11/17/2016   Procedure: CESAREAN SECTION;  Surgeon: [place] BingPickens, Charlie, MD;  [facility];  Service: Obstetrics;  Laterality: N/A;     OB History    Gravida  3   Para  2   Term      Preterm  2   AB  1   Living  1     SAB  0   TAB  1   Ectopic      Multiple  0   Live Births  1            Home Medications    Prior to Admission medications   Medication Sig Start Date End Date Taking? Authorizing Provider  ibuprofen (ADVIL,MOTRIN) 600 MG tablet Take 1 tablet (600 mg total) by mouth every 6 (six) hours as needed for mild pain or  cramping. Patient not taking: Reported on 12/28/2016 11/20/16   Lorne SkeensSchenk, Nicholas Michael, MD  oxyCODONE (OXY IR/ROXICODONE) 5 MG immediate release tablet Take 1 tablet (5 mg total) by mouth every 4 (four) hours as needed (pain scale 4-7). Patient not taking: Reported on 12/28/2016 11/20/16   Lorne SkeensSchenk, Nicholas Michael, MD  Prenatal Vit-Fe Fumarate-FA (PRENATAL MULTIVITAMIN) TABS tablet Take 1 tablet by mouth daily.    [provider]  senna-docusate (SENOKOT-S) 8.6-50 MG tablet Take 1 tablet by mouth daily. Patient not taking: Reported on 12/28/2016 11/20/16   Lorne SkeensSchenk, Nicholas Michael, MD    Family History Family History  Problem Relation Age of Onset  . Hypertension Father   . Heart disease Father        has a pacemaker    Social History Social History   Tobacco Use  . Smoking status: Former Smoker    Years: 0.50    Types: Cigarettes  . Smokeless tobacco: Never Used  . Tobacco comment: 1-2 PER DAY  Substance Use Topics  . Alcohol use: Yes    Alcohol/week: 1.0 standard drinks    Types: 1 Glasses of wine per week    Comment: occasionally  . Drug use: No     Allergies   Patient has no known  allergies.   Review of Systems Review of Systems  Constitutional: Negative for chills and fever.  HENT: Negative for congestion.   Respiratory: Negative for shortness of breath.   Cardiovascular: Negative for chest pain.  Gastrointestinal: Negative for abdominal pain and vomiting.  Genitourinary: Positive for vaginal discharge. Negative for dysuria and flank pain.  Musculoskeletal: Negative for back pain, neck pain and neck stiffness.  Skin: Negative for rash.  Neurological: Negative for light-headedness and headaches.     Physical Exam Updated Vital Signs BP (!) 136/92   Pulse 81   Temp 98 F (36.7 C) (Oral)   Resp 16   SpO2 100%   Physical Exam  Constitutional: She is oriented to person, place, and time. She appears well-developed and well-nourished.  HENT:  Head:  Normocephalic and atraumatic.  Eyes: Conjunctivae are normal. Right eye exhibits no discharge. Left eye exhibits no discharge.  Neck: Normal range of motion. Neck supple. No tracheal deviation present.  Cardiovascular: Normal rate.  Pulmonary/Chest: Effort normal.  Abdominal: Soft. She exhibits no distension. There is no tenderness. There is no guarding.  Genitourinary:  Genitourinary Comments: Suture and cervix visualized, mild vaginal discharge, no bleeding, no cervical motion tenderness  Musculoskeletal: She exhibits no edema.  Neurological: She is alert and oriented to person, place, and time.  Skin: Skin is warm. No rash noted.  Psychiatric: She has a normal mood and affect.  Nursing note and vitals reviewed.    ED Treatments / Results  Labs (all labs ordered are listed, but only abnormal results are displayed) Labs Reviewed  WET PREP, GENITAL - Abnormal; Notable for the following components:      Result Value   Yeast Wet Prep HPF POC PRESENT (*)    Clue Cells Wet Prep HPF POC PRESENT (*)    WBC, Wet Prep HPF POC MANY (*)    All other components within normal limits  URINALYSIS, ROUTINE W REFLEX MICROSCOPIC - Abnormal; Notable for the following components:   Leukocytes, UA SMALL (*)    All other components within normal limits  CBG MONITORING, ED - Abnormal; Notable for the following components:   Glucose-Capillary 125 (*)    All other components within normal limits  POC URINE PREG, ED  GC/CHLAMYDIA PROBE AMP ([place]) NOT AT [facility]    EKG None  Radiology No results found.  Procedures Procedures (including critical care time)  Medications Ordered in ED Medications  fluconazole (DIFLUCAN) tablet 150 mg (150 mg Oral Given 02/18/18 2134)     Initial Impression / Assessment and Plan / ED Course  I have reviewed the triage vital signs and the nursing notes.  Pertinent labs & imaging results that were available during my care of the patient were reviewed by me  and considered in my medical decision making (see chart for details).    Patient presents with vaginal discharge, treatment given in the ER for candidal infection.  Discussed importance of follow-up with gynecology later this week.  Patient will follow-up remainder of gonorrhea/chlamydia cultures. With recurrent infections point-of-care glucose obtained low 100s.  Primary care follow-up for this. Preg test negative.   Results and differential diagnosis were discussed with the patient/parent/guardian. Xrays were independently reviewed by myself.  Close follow up outpatient was discussed, comfortable with the plan.   Medications  fluconazole (DIFLUCAN) tablet 150 mg (150 mg Oral Given 02/18/18 2134)    Vitals:   02/18/18 1844 02/18/18 1945 02/18/18 2015 02/18/18 2130  BP: 121/83 (!) 142/96 (!) 129/98 Marland Kitchen(!)  136/92  Pulse: 84 83 79 81  Resp: 16     Temp:      TempSrc:      SpO2: 100% 100% 100% 100%    Final diagnoses:  Vaginal discharge  BV (bacterial vaginosis)  Vaginal candidiasis     Final Clinical Impressions(s) / ED Diagnoses   Final diagnoses:  Vaginal discharge  BV (bacterial vaginosis)  Vaginal candidiasis    ED Discharge Orders    None       Blane Ohara, MD 02/21/18 [id number]

## 2018-06-05 ENCOUNTER — Other Ambulatory Visit (HOSPITAL_COMMUNITY)
Admission: RE | Admit: 2018-06-05 | Discharge: 2018-06-05 | Disposition: A | Discharge status: Home / Self Care | Payer: BLUE CROSS/BLUE SHIELD | Source: Ambulatory Visit | Attending: Obstetrics & Gynecology | Admitting: Obstetrics & Gynecology

## 2018-06-05 ENCOUNTER — Encounter: Payer: Self-pay | Admitting: Obstetrics & Gynecology

## 2018-06-05 ENCOUNTER — Ambulatory Visit (INDEPENDENT_AMBULATORY_CARE_PROVIDER_SITE_OTHER): Payer: Medicaid Other | Admitting: Obstetrics & Gynecology

## 2018-06-05 VITALS — BP 132/96 | HR 76 | Ht 67.0 in | Wt 146.2 lb

## 2018-06-05 DIAGNOSIS — Z23 Encounter for immunization: Secondary | ICD-10-CM

## 2018-06-05 DIAGNOSIS — Z01419 Encounter for gynecological examination (general) (routine) without abnormal findings: Secondary | ICD-10-CM | POA: Insufficient documentation

## 2018-06-05 DIAGNOSIS — Z0000 Encounter for general adult medical examination without abnormal findings: Secondary | ICD-10-CM | POA: Diagnosis not present

## 2018-06-05 MED ORDER — METRONIDAZOLE 500 MG PO TABS: 500.0000 mg | ORAL_TABLET | Freq: Two times a day (BID) | ORAL | 0 refills | Status: DC

## 2018-06-05 MED ORDER — FLUCONAZOLE 150 MG PO TABS: 150.0000 mg | ORAL_TABLET | Freq: Once | ORAL | 3 refills | Status: AC

## 2018-06-05 NOTE — Patient Instructions (Signed)

## 2018-06-05 NOTE — Progress Notes (Signed)
Subjective:    Tamara Cowan is a 27 y.o. single 50P1 (29 yo son) female who presents for an annual exam. She reports that her boyfriend feels the cerclage that was not removed during her cesarean section 5/18. She has a vaginal discharge that she can't seem to get rid of, she tried diflucan. The patient is sexually active. GYN screening history: last pap: was abnormal: ASCUS with + HR HPV.. The patient wears seatbelts: yes. The patient participates in regular exercise: yes. Has the patient ever been transfused or tattooed?: yes. The patient reports that there is not domestic violence in her life.   Menstrual History: OB History    Gravida  3   Para  2   Term      Preterm  2   AB  1   Living  1     SAB  0   TAB  1   Ectopic      Multiple  0   Live Births  1           Menarche age: [age] Patient's last menstrual period was 05/08/2018 (approximate).    The following portions of the patient's history were reviewed and updated as appropriate: allergies, current medications, past family history, past medical history, past social history, past surgical history and problem list.  Review of Systems Pertinent items are noted in HPI.   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 Uses condoms for contraception   Objective:    BP (!) 149/99   Pulse 86   Ht 5\' 7"  (1.702 m)   Wt 146 lb 3.2 oz (66.3 kg)   LMP 05/08/2018 (Approximate)   BMI 22.90 kg/m   General Appearance:    Alert, cooperative, no distress, appears stated age  Head:    Normocephalic, without obvious abnormality, atraumatic  Eyes:    PERRL, conjunctiva/corneas clear, EOM's intact, fundi    benign, both eyes  Ears:    Normal TM's and external ear canals, both ears  Nose:   Nares normal, septum midline, mucosa normal, no drainage    or sinus tenderness  Throat:   Lips, mucosa, and tongue normal; teeth and gums normal  Neck:   Supple, symmetrical, trachea midline, no adenopathy;     thyroid:  no enlargement/tenderness/nodules; no carotid   bruit or JVD  Back:     Symmetric, no curvature, ROM normal, no CVA tenderness  Lungs:     Clear to auscultation bilaterally, respirations unlabored  Chest Wall:    No tenderness or deformity   Heart:    Regular rate and rhythm, S1 and S2 normal, no murmur, rub   or gallop  Breast Exam:    No tenderness, masses, or nipple abnormality  Abdomen:     Soft, non-tender, bowel sounds active all four quadrants,    no masses, no organomegaly  Genitalia:    Normal female without lesion, discharge or tenderness Discharge c/w BV, long proline or PDS knot noted. I cut the knot but the remainder of the cerclage was not visible, normal size and shape, anteverted, mobile, non-tender, normal adnexal exam      Extremities:   Extremities normal, atraumatic, no cyanosis or edema  Pulses:   2+ and symmetric all extremities  Skin:   Skin color, texture, turgor normal, no rashes or lesions  Lymph nodes:   Cervical, supraclavicular, and axillary nodes normal  Neurologic:   CNII-XII intact, normal strength, sensation and reflexes  throughout  .    Assessment:    Healthy female exam.    Plan:     Thin prep Pap smear. with cotesting Refer to fam med for general health maintenance Treat with flagyl Flu vaccine today Gardasil today Come back in 2 months for Gardasil #2

## 2018-06-06 LAB — CYTOLOGY - PAP
Bacterial vaginitis: NEGATIVE
CANDIDA VAGINITIS: POSITIVE — AB
Chlamydia: NEGATIVE
Diagnosis: NEGATIVE
NEISSERIA GONORRHEA: NEGATIVE
TRICH (WINDOWPATH): NEGATIVE

## 2018-06-12 ENCOUNTER — Telehealth: Payer: Self-pay

## 2018-06-12 ENCOUNTER — Telehealth: Payer: Self-pay | Admitting: *Deleted

## 2018-06-12 DIAGNOSIS — B373 Candidiasis of vulva and vagina: Secondary | ICD-10-CM

## 2018-06-12 DIAGNOSIS — B3731 Acute candidiasis of vulva and vagina: Secondary | ICD-10-CM

## 2018-06-12 MED ORDER — FLUCONAZOLE 150 MG PO TABS: 150.0000 mg | ORAL_TABLET | Freq: Once | ORAL | 0 refills | Status: AC

## 2018-06-12 NOTE — Telephone Encounter (Signed)
Call Pts to advise pt of + Yeast, & that Rx Diflucan is being sent to pharmacy. Home phone # no answer, VM full. Called  Cell# on file, female answered & stated that she was not available at this time. Asked if he could relay message for her to call us back, he stated that he would.

## 2018-06-13 NOTE — Telephone Encounter (Signed)
error 

## 2018-06-13 NOTE — Telephone Encounter (Signed)
Sent pt a Clinical cytogeneticistmychart message advising of + yeast and that and rx was sent to her pharmacy.

## 2018-08-08 ENCOUNTER — Ambulatory Visit: Payer: Medicaid Other

## 2018-09-12 ENCOUNTER — Telehealth: Payer: Self-pay | Admitting: Family Medicine

## 2018-09-12 NOTE — Telephone Encounter (Signed)
Attempted to call patient to get her appointment rescheduled that she requested via answering service. No answer, VM was full and could not leave a VM.

## 2018-12-05 ENCOUNTER — Ambulatory Visit: Payer: Medicaid Other

## 2019-03-30 ENCOUNTER — Telehealth: Payer: Self-pay | Admitting: Family Medicine

## 2019-03-30 NOTE — Telephone Encounter (Signed)
Attempted to call patient x2 about her appointment on 9/28 @ 9:00. Unable to reach patient due to the number just ringing busy.

## 2019-04-02 ENCOUNTER — Ambulatory Visit: Payer: Medicaid Other

## 2019-04-09 ENCOUNTER — Ambulatory Visit (INDEPENDENT_AMBULATORY_CARE_PROVIDER_SITE_OTHER): Payer: Self-pay

## 2019-04-09 ENCOUNTER — Other Ambulatory Visit: Payer: Self-pay

## 2019-04-09 ENCOUNTER — Other Ambulatory Visit (HOSPITAL_COMMUNITY)
Admission: RE | Admit: 2019-04-09 | Discharge: 2019-04-09 | Disposition: A | Discharge status: Home / Self Care | Payer: Medicaid Other | Source: Ambulatory Visit | Attending: Family Medicine | Admitting: Family Medicine

## 2019-04-09 DIAGNOSIS — Z202 Contact with and (suspected) exposure to infections with a predominantly sexual mode of transmission: Secondary | ICD-10-CM | POA: Insufficient documentation

## 2019-04-09 NOTE — Progress Notes (Signed)
Pt here for Self Swab, advised results will be available in My Chart within 24 to 48hrs. Pt verbalized understanding.

## 2019-04-16 LAB — CERVICOVAGINAL ANCILLARY ONLY
Bacterial Vaginitis (gardnerella): POSITIVE — AB
Candida Glabrata: NEGATIVE
Candida Vaginitis: NEGATIVE
Chlamydia: NEGATIVE
Comment: NEGATIVE
Comment: NEGATIVE
Comment: NEGATIVE
Comment: NEGATIVE
Neisseria Gonorrhea: NEGATIVE
Trichomonas: NEGATIVE

## 2019-04-17 ENCOUNTER — Other Ambulatory Visit: Payer: Self-pay | Admitting: Family Medicine

## 2019-04-17 DIAGNOSIS — B9689 Other specified bacterial agents as the cause of diseases classified elsewhere: Secondary | ICD-10-CM

## 2019-04-17 MED ORDER — METRONIDAZOLE 500 MG PO TABS: 500.0000 mg | ORAL_TABLET | Freq: Two times a day (BID) | ORAL | 0 refills | Status: DC

## 2019-07-06 NOTE — L&D Delivery Note (Signed)
Delivery Note  Patient delivered nonviable female fetus at 2128. Delivered en caul with placenta. Bleeding normal. No lacerations.   Levie Heritage, DO 11/27/2019 10:27 PM

## 2019-09-06 ENCOUNTER — Emergency Department (HOSPITAL_COMMUNITY)
Admission: EM | Admit: 2019-09-06 | Discharge: 2019-09-06 | Discharge status: Against Medical Advice | Payer: Medicaid Other | Attending: Emergency Medicine | Admitting: Emergency Medicine

## 2019-09-06 ENCOUNTER — Encounter (HOSPITAL_COMMUNITY): Payer: Self-pay | Admitting: Emergency Medicine

## 2019-09-06 ENCOUNTER — Other Ambulatory Visit: Payer: Self-pay

## 2019-09-06 DIAGNOSIS — N898 Other specified noninflammatory disorders of vagina: Secondary | ICD-10-CM | POA: Diagnosis not present

## 2019-09-06 DIAGNOSIS — Z3A01 Less than 8 weeks gestation of pregnancy: Secondary | ICD-10-CM | POA: Insufficient documentation

## 2019-09-06 DIAGNOSIS — R1030 Lower abdominal pain, unspecified: Secondary | ICD-10-CM | POA: Diagnosis not present

## 2019-09-06 DIAGNOSIS — O209 Hemorrhage in early pregnancy, unspecified: Secondary | ICD-10-CM | POA: Diagnosis not present

## 2019-09-06 DIAGNOSIS — O4690 Antepartum hemorrhage, unspecified, unspecified trimester: Secondary | ICD-10-CM

## 2019-09-06 LAB — CBC WITH DIFFERENTIAL/PLATELET
Abs Immature Granulocytes: 0.01 10*3/uL (ref 0.00–0.07)
Basophils Absolute: 0 10*3/uL (ref 0.0–0.1)
Basophils Relative: 1 %
Eosinophils Absolute: 0.1 10*3/uL (ref 0.0–0.5)
Eosinophils Relative: 2 %
HCT: 38.5 % (ref 36.0–46.0)
Hemoglobin: 12.3 g/dL (ref 12.0–15.0)
Immature Granulocytes: 0 %
Lymphocytes Relative: 27 %
Lymphs Abs: 1.7 10*3/uL (ref 0.7–4.0)
MCH: 27.2 pg (ref 26.0–34.0)
MCHC: 31.9 g/dL (ref 30.0–36.0)
MCV: 85 fL (ref 80.0–100.0)
Monocytes Absolute: 0.7 10*3/uL (ref 0.1–1.0)
Monocytes Relative: 12 %
Neutro Abs: 3.6 10*3/uL (ref 1.7–7.7)
Neutrophils Relative %: 58 %
Platelets: 260 10*3/uL (ref 150–400)
RBC: 4.53 MIL/uL (ref 3.87–5.11)
RDW: 12.9 % (ref 11.5–15.5)
WBC: 6.2 10*3/uL (ref 4.0–10.5)
nRBC: 0 % (ref 0.0–0.2)

## 2019-09-06 LAB — HCG, QUANTITATIVE, PREGNANCY: hCG, Beta Chain, Quant, S: 41693 m[IU]/mL — ABNORMAL HIGH (ref <5)

## 2019-09-06 LAB — URINALYSIS, ROUTINE W REFLEX MICROSCOPIC
Bilirubin Urine: NEGATIVE
Glucose, UA: NEGATIVE mg/dL
Hgb urine dipstick: NEGATIVE
Ketones, ur: NEGATIVE mg/dL
Leukocytes,Ua: NEGATIVE
Nitrite: NEGATIVE
Protein, ur: NEGATIVE mg/dL
Specific Gravity, Urine: 1.015 (ref 1.005–1.030)
pH: 7 (ref 5.0–8.0)

## 2019-09-06 LAB — COMPREHENSIVE METABOLIC PANEL
ALT: 25 U/L (ref 0–44)
AST: 21 U/L (ref 15–41)
Albumin: 4.6 g/dL (ref 3.5–5.0)
Alkaline Phosphatase: 56 U/L (ref 38–126)
Anion gap: 7 (ref 5–15)
BUN: 11 mg/dL (ref 6–20)
CO2: 26 mmol/L (ref 22–32)
Calcium: 9.4 mg/dL (ref 8.9–10.3)
Chloride: 102 mmol/L (ref 98–111)
Creatinine, Ser: 0.74 mg/dL (ref 0.44–1.00)
GFR calc Af Amer: >60 mL/min (ref >60)
GFR calc non Af Amer: >60 mL/min (ref >60)
Glucose, Bld: 88 mg/dL (ref 70–99)
Potassium: 3.5 mmol/L (ref 3.5–5.1)
Sodium: 135 mmol/L (ref 135–145)
Total Bilirubin: 0.2 mg/dL — ABNORMAL LOW (ref 0.3–1.2)
Total Protein: 8 g/dL (ref 6.5–8.1)

## 2019-09-06 LAB — RAPID HIV SCREEN (HIV 1/2 AB+AG)
HIV 1/2 Antibodies: NONREACTIVE
HIV-1 P24 Antigen - HIV24: NONREACTIVE

## 2019-09-06 LAB — I-STAT BETA HCG BLOOD, ED (MC, WL, AP ONLY): I-stat hCG, quantitative: >2000 m[IU]/mL — ABNORMAL HIGH (ref <5)

## 2019-09-06 LAB — ABO/RH: ABO/RH(D): O POS

## 2019-09-06 NOTE — ED Triage Notes (Signed)
Pt reports she thinks she is pregant. Pt reports positive pregnancy test 2 days ago. States vaginal bleeding started this morning. Denies pain. Reports hx of pregnancy complications. States she delivered at 25 weeks.

## 2019-09-06 NOTE — ED Provider Notes (Signed)
[place] DEPT Provider Note   CSN: [id number] Arrival date & time: 09/06/19  1359     History Chief Complaint  Patient presents with  . Vaginal Bleeding    Tamara Cowan is a 29 y.o. female [phone] with a past medical history significant for preterm labor complicated by incompetent cervix who presents to the ED due to light vaginal bleeding that started this morning. Patient notes she had a positive pregnancy test 2 days ago. LMP was beginning of February, but she admits to irregular menstrual cycles. Vaginal bleeding is light and not enough to fill a pad or panty liner. She admits to intermittent, suprapubic, sharp, shooting pain for the past day that occurs 3-4 times daily. Patient also admits to increased white vaginal discharge over the past few days. Denies dysuria, back pain, fever, and chills. She is currently sexually active with one partner without protection. Denies nausea, vomiting, and diarrhea.   History obtained from patient and past medical records. No interpreter used during encounter.      Past Medical History:  Diagnosis Date  . Preterm labor   . UTI (urinary tract infection) 2010   during pregnancy    Patient Active Problem List   Diagnosis Date Noted  . Status post C-section 11/20/2016  . Breech presentation at birth 11/20/2016  . Threatened preterm labor, second trimester 11/15/2016  . Cervical cerclage suture present in second trimester 10/20/2016  . 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 Supervision of high-risk pregnancy 09/08/2016  . ASCUS with positive high risk HPV cervical 09/08/2016    Past Surgical History:  Procedure Laterality Date  . CERVICAL CERCLAGE N/A 09/11/2016   Procedure: CERCLAGE CERVICAL;  Surgeon: Woodroe Mode, MD;  Location: [facility];  Service: Gynecology;  Laterality: N/A;  . CESAREAN SECTION N/A 11/17/2016   Procedure: CESAREAN SECTION;  Surgeon: Aletha Halim, MD;  Location: [place];   Service: Obstetrics;  Laterality: N/A;     OB History    Gravida  3   Para  2   Term      Preterm  2   AB  1   Living  1     SAB  0   TAB  1   Ectopic      Multiple  0   Live Births  1           Family History  Problem Relation Age of Onset  . Hypertension Father   . Heart disease Father        has a pacemaker    Social History   Tobacco Use  . Smoking status: Former Smoker    Years: 0.50    Types: Cigarettes  . Smokeless tobacco: Never Used  . Tobacco comment: 1-2 PER DAY  Substance Use Topics  . Alcohol use: Yes    Alcohol/week: 1.0 standard drinks    Types: 1 Glasses of wine per week    Comment: occasionally  . Drug use: No    Home Medications Prior to Admission medications   Medication Sig Start Date End Date Taking? Authorizing Provider  metroNIDAZOLE (FLAGYL) 500 MG tablet Take 1 tablet (500 mg total) by mouth 2 (two) times daily. 04/17/19   FairMarin Shutter, MD    Allergies    Patient has no known allergies.  Review of Systems   Review of Systems  Constitutional: Negative for chills and fever.  Respiratory: Negative for shortness of breath.   Cardiovascular: Negative for chest pain.  Gastrointestinal:  Positive for abdominal pain (suprapubic). Negative for diarrhea, nausea and vomiting.  Genitourinary: Positive for vaginal bleeding and vaginal discharge. Negative for dysuria and flank pain.  All other systems reviewed and are negative.   Physical Exam Updated Vital Signs BP 135/89 (BP Location: Left Arm)   Pulse 89   Temp 99.2 F (37.3 C) (Oral)   Resp 18   SpO2 100%   Physical Exam Vitals and nursing note reviewed.  Constitutional:      General: She is not in acute distress.    Appearance: She is not ill-appearing.  HENT:     Head: Normocephalic.  Eyes:     Conjunctiva/sclera: Conjunctivae normal.  Cardiovascular:     Rate and Rhythm: Normal rate and regular rhythm.     Pulses: Normal pulses.     Heart sounds: Normal  heart sounds. No murmur. No friction rub. No gallop.   Pulmonary:     Effort: Pulmonary effort is normal.     Breath sounds: Normal breath sounds.  Abdominal:     General: Abdomen is flat. Bowel sounds are normal. There is no distension.     Palpations: Abdomen is soft.     Tenderness: There is no abdominal tenderness. There is no guarding or rebound.     Comments: Abdomen soft, nondistended, nontender to palpation in all quadrants without guarding or peritoneal signs. No rebound.   Genitourinary:    Comments: Unable to perform pelvic exam prior to patient leaving AMA Musculoskeletal:     Cervical back: Neck supple.     Comments: Able to move all 4 extremities without difficulty. No lower extremity edema.   Skin:    General: Skin is warm and dry.  Neurological:     General: No focal deficit present.     Mental Status: She is alert.  Psychiatric:        Mood and Affect: Mood normal.        Behavior: Behavior normal.     ED Results / Procedures / Treatments   Labs (all labs ordered are listed, but only abnormal results are displayed) Labs Reviewed  COMPREHENSIVE METABOLIC PANEL - Abnormal; Notable for the following components:      Result Value   Total Bilirubin 0.2 (*)    All other components within normal limits  URINALYSIS, ROUTINE W REFLEX MICROSCOPIC - Abnormal; Notable for the following components:   Color, Urine STRAW (*)    All other components within normal limits  I-STAT BETA HCG BLOOD, ED ([facility], [facility], [facility] ONLY) - Abnormal; Notable for the following components:   I-stat hCG, quantitative >2,000.0 (*)    All other components within normal limits  WET PREP, GENITAL  CBC WITH DIFFERENTIAL/PLATELET  RAPID HIV SCREEN (HIV 1/2 AB+AG)  RPR  HCG, QUANTITATIVE, PREGNANCY  ABO/RH  GC/CHLAMYDIA PROBE AMP ([place]) NOT AT [facility]    EKG None  Radiology No results found.  Procedures Procedures (including critical care time)  Medications Ordered in ED Medications - No  data to display  ED Course  I have reviewed the triage vital signs and the nursing notes.  Pertinent labs & imaging results that were available during my care of the patient were reviewed by me and considered in my medical decision making (see chart for details).    MDM Rules/Calculators/A&P                     29 year old female presents to the ED for evaluation of vaginal bleeding  after having a positive pregnancy test 2 days ago associated with intermittent suprapubic pain. History of preterm labor due to incompetent cervix in 2018. Vitals all within normal limits. Patient in no acute distress and non-ill appearing. Abdomen soft, non-distended, and non-tender. Will obtain routine labs and [place] to rule out ectopic pregnancy.  CBC reassuring with no leukocytosis.  CMP reassuring with normal renal function and electrolyte derangements.  I-STAT beta hCG positive. [place] not obtained prior to patient leaving AMA to go pick up her son.   4:42 PM informed by RN that patient left AMA.  Unable to speak to patient prior to leaving the ED; however, I spoke to patient over the phone to discuss my concerns about a possible ectopic pregnancy. We discussed the nature and purpose, risks and benefits, as well as, the alternatives of treatment. Time was given to allow the opportunity to ask questions and consider their options, and after the discussion, the patient decided to refuse the offerred treatment at this time because she had to go pick up her son. The patient was informed that refusal could lead to, but was not limited to, death, permanent disability, or severe pain. Prior to refusing, I determined that the patient had the capacity to make their decision and understood the consequences of that decision. After refusal, I made every reasonable opportunity to treat them to the best of my ability.The patient was notified that they may return to the emergency department at any time for further treatment. I discussed  the results that I had thus far. Patient agrees to return to [facility] when able to to receive [place]. I advised patient to call her OBGYN to schedule an appointment. I was unable to perform the pelvic exam prior to patient leaving the ED. Patient states understanding over the phone.   Final Clinical Impression(s) / ED Diagnoses Final diagnoses:  Vaginal bleeding in pregnancy    Rx / DC Orders ED Discharge Orders    None       Jesusita Oka 09/06/19 1648    Bethann Berkshire, MD 09/07/19 1157

## 2019-09-06 NOTE — ED Notes (Addendum)
Patient ambulatory to nurse's station. States she has to leave to go and pick up son. Patient signed AMA. PA made aware.

## 2019-09-07 LAB — RPR: RPR Ser Ql: NONREACTIVE

## 2019-09-16 ENCOUNTER — Other Ambulatory Visit: Payer: Self-pay

## 2019-09-16 ENCOUNTER — Inpatient Hospital Stay (HOSPITAL_COMMUNITY)
Admission: AD | Admit: 2019-09-16 | Discharge: 2019-09-16 | Disposition: A | Discharge status: Home / Self Care | Payer: Medicaid Other | Attending: Obstetrics and Gynecology | Admitting: Obstetrics and Gynecology

## 2019-09-16 ENCOUNTER — Encounter: Payer: Self-pay | Admitting: Emergency Medicine

## 2019-09-16 ENCOUNTER — Inpatient Hospital Stay (HOSPITAL_COMMUNITY): Payer: Medicaid Other

## 2019-09-16 DIAGNOSIS — O26899 Other specified pregnancy related conditions, unspecified trimester: Secondary | ICD-10-CM

## 2019-09-16 DIAGNOSIS — Z87891 Personal history of nicotine dependence: Secondary | ICD-10-CM | POA: Diagnosis not present

## 2019-09-16 DIAGNOSIS — R109 Unspecified abdominal pain: Secondary | ICD-10-CM | POA: Insufficient documentation

## 2019-09-16 DIAGNOSIS — Z3A01 Less than 8 weeks gestation of pregnancy: Secondary | ICD-10-CM

## 2019-09-16 DIAGNOSIS — O219 Vomiting of pregnancy, unspecified: Secondary | ICD-10-CM

## 2019-09-16 DIAGNOSIS — Z3490 Encounter for supervision of normal pregnancy, unspecified, unspecified trimester: Secondary | ICD-10-CM

## 2019-09-16 DIAGNOSIS — O26891 Other specified pregnancy related conditions, first trimester: Secondary | ICD-10-CM | POA: Insufficient documentation

## 2019-09-16 DIAGNOSIS — O26851 Spotting complicating pregnancy, first trimester: Secondary | ICD-10-CM

## 2019-09-16 LAB — URINALYSIS, ROUTINE W REFLEX MICROSCOPIC
Bilirubin Urine: NEGATIVE
Glucose, UA: NEGATIVE mg/dL
Hgb urine dipstick: NEGATIVE
Ketones, ur: NEGATIVE mg/dL
Nitrite: NEGATIVE
Protein, ur: NEGATIVE mg/dL
Specific Gravity, Urine: 1.024 (ref 1.005–1.030)
pH: 6 (ref 5.0–8.0)

## 2019-09-16 LAB — WET PREP, GENITAL
Clue Cells Wet Prep HPF POC: NONE SEEN
Sperm: NONE SEEN
Trich, Wet Prep: NONE SEEN
Yeast Wet Prep HPF POC: NONE SEEN

## 2019-09-16 MED ORDER — METOCLOPRAMIDE HCL 10 MG PO TABS: 10.0000 mg | ORAL_TABLET | Freq: Four times a day (QID) | ORAL | 1 refills | Status: DC

## 2019-09-16 MED ORDER — METOCLOPRAMIDE HCL 10 MG PO TABS
10.0000 mg | ORAL_TABLET | Freq: Once | ORAL | Status: AC
  Administered 2019-09-16: 10 mg via ORAL
  Filled 2019-09-16: qty 1

## 2019-09-16 NOTE — ED Triage Notes (Addendum)
Pt states she was seen at [facility] last week and had + preg test and took + home test.  C/o spotting and lower abd pain with nausea x 1 week.  Last spotted 2 days ago.  LMP in January but states periods are irregular.  G3 P0 A1 L1. Evelena Leyden, PA at triage for [facility].

## 2019-09-16 NOTE — ED Provider Notes (Signed)
MSE was initiated and I personally evaluated the patient and placed orders (if any) at  3:51 PM on September 16, 2019.  The patient appears stable so that the remainder of the MSE may be completed by another provider.  29 year old female G40P1 with PMH significant for cervical insufficiency and cervical cerclage who presents to the ED with positive pregnancy determined by beta-hCG last week in the ER when she was evaluated for lower abdominal discomfort and spotting.  She had to leave prior to ultrasound testing and returns to the ED today with continued intermittent spotting and occasional suprapubic abdominal pain.  She also endorses constant nausea, but no emesis.  She is in no acute distress on my examination.  She also endorses mild heartburn, but no other symptoms.  Her vital signs, despite mildly elevated blood pressure, are otherwise unremarkable.  Medically cleared and safe for [facility] transport.   Spoke with Santina Evans who will accept patient to MAU.   Lorelee New, PA-C 09/16/19 1555    Eber Hong, MD 09/16/19 [phone]

## 2019-09-16 NOTE — MAU Provider Note (Signed)
Patient Tamara Cowan is a 29 y.o. [phone]  at Unknown here with complaints of nausea and sharp cramping and two episodes of link pink vaginal spotting.  She denies dizziness, SOB, fever, abnormal vaginal discharge, dysuria, itching or odor.   Her gestational age is unknown; last period was in mid-January but her periods are irregular.   She has not had intercourse in the past 24 hours.  History     CSN: [id number]  Arrival date and time: 09/16/19 1518   None     Chief Complaint  Patient presents with  . Vaginal Bleeding  . Abdominal Pain  . Nausea   Vaginal Bleeding [name] primary symptoms include vaginal bleeding. The problem occurs rarely (She had spotting two weeks ago and was seen at [facility]. No more spotting until two days ago. It was on her toilet paper. She did not have to wear a pad or change her underwear. ). Associated symptoms include abdominal pain. Pertinent negatives include no constipation, diarrhea or dysuria. The vaginal discharge was watery. The vaginal bleeding is spotting. She has not been passing clots. She has not been passing tissue.  Abdominal Pain This is a new problem. The current episode started 1 to 4 weeks ago. The problem occurs intermittently. Pertinent negatives include no constipation, diarrhea or dysuria. Relieved by: She didn't try anything for the pain.    Nausea started two weeks ago. It is "round the clock". No vomiting. She endorses loss of appetite. She last had some lasagne about 2 hours ago. She didn't throw it up but the nausea increases so much that she doesn't want to eat.  OB History    Gravida  4   Para  2   Term      Preterm  2   AB  1   Living  1     SAB  0   TAB  1   Ectopic  0   Multiple  0   Live Births  1           Past Medical History:  Diagnosis Date  . Preterm labor   . UTI (urinary tract infection) 2010   during pregnancy    Past Surgical History:  Procedure Laterality Date  .  CERVICAL CERCLAGE N/A 09/11/2016   Procedure: CERCLAGE CERVICAL;  Surgeon: Woodroe Mode, MD;  Location: [facility];  Service: Gynecology;  Laterality: N/A;  . CESAREAN SECTION N/A 11/17/2016   Procedure: CESAREAN SECTION;  Surgeon: Aletha Halim, MD;  Location: [facility];  Service: Obstetrics;  Laterality: N/A;    Family History  Problem Relation Age of Onset  . Hypertension Father   . Heart disease Father        has a pacemaker    Social History   Tobacco Use  . Smoking status: Former Smoker    Years: 0.50    Types: Cigarettes  . Smokeless tobacco: Never Used  . Tobacco comment: 1-2 PER DAY  Substance Use Topics  . Alcohol use: Yes    Alcohol/week: 1.0 standard drinks    Types: 1 Glasses of wine per week    Comment: occasionally  . Drug use: No    Allergies: No Known Allergies  Medications Prior to Admission  Medication Sig Dispense Refill Last Dose  . metroNIDAZOLE (FLAGYL) 500 MG tablet Take 1 tablet (500 mg total) by mouth 2 (two) times daily. 14 tablet 0     Review of Systems  HENT: Negative.   Respiratory: Negative.  Cardiovascular: Negative.   Gastrointestinal: Positive for abdominal pain. Negative for constipation and diarrhea.  Genitourinary: Positive for vaginal bleeding. Negative for dysuria.  Neurological: Negative.    Physical Exam   Blood pressure 130/70, pulse 83, temperature 98.3 F (36.8 C), temperature source Oral, resp. rate 16, height 5\' 7"  (1.702 m), weight 70.3 kg, SpO2 100 %, unknown if currently breastfeeding.  Physical Exam  Constitutional: She is oriented to person, place, and time. She appears well-developed.  HENT:  Head: Normocephalic.  Respiratory: Effort normal.  GI: Soft.  Genitourinary:    Genitourinary Comments: NEFG; no blood in vagina, thick white discharge but no clumps or odors. No CMT, suprapubic or adnexal tenderness.    Musculoskeletal:        General: Normal range of motion.     Cervical back: Normal range  of motion.  Neurological: She is alert and oriented to person, place, and time.    MAU Course  Procedures  MDM - done to confirm viability as patient complaining of abdominal pain and vaginal bleeding; US shows viable IUP with cardiac activity -wet prep negative; GC pending -Patient had Reglan and tolerated ginger ale.  -UA normal, no signs of dehydration or infection.   Patient feels well; no abdominal pain or bleeding while in MAU.  Assessment and Plan   1. Intrauterine pregnancy   2. Abdominal pain affecting pregnancy    2. Patient stable for discharge with plan to call [facility] for prenatal care visit. GC is pending.   3. [place] results given to patient; explained that they are reassuring today and that there is no sign of [facility].   4. Rx for Reglan given; all questions answered.   [facility] Joellyn Grandt 09/16/2019, 4:50 PM

## 2019-09-16 NOTE — MAU Note (Signed)
Patient states she she "feels good" after the Reglan administration with nausea improvement. Tolerating ginger ale at this time.

## 2019-09-16 NOTE — MAU Note (Signed)
Tamara Cowan is a 29 y.o. at Unknown here in MAU reporting:  +vaginal bleeding. Spotting. Intermittent. Not bleeding currently. Reports last episode of vaginal bleeding was 2 days ago.  +lower abdominal pain. Intermittent. "sharp and shooting like pain" +nausea  LMP: January; unsure of date due to irregularity of periods Onset of complaint: 2 weeks Pain score: 7-8 Vitals:   09/16/19 1520 09/16/19 1631  BP: (!) 151/89 130/70  Pulse: 91 83  Resp: 16 16  Temp: 98.3 F (36.8 C) 98.3 F (36.8 C)  SpO2: 100% 100%    Lab orders placed from triage: ua

## 2019-09-17 LAB — GC/CHLAMYDIA PROBE AMP (~~LOC~~) NOT AT ARMC
Chlamydia: NEGATIVE
Comment: NEGATIVE
Comment: NORMAL
Neisseria Gonorrhea: NEGATIVE

## 2019-10-24 ENCOUNTER — Other Ambulatory Visit: Payer: Self-pay

## 2019-10-24 ENCOUNTER — Ambulatory Visit (INDEPENDENT_AMBULATORY_CARE_PROVIDER_SITE_OTHER): Payer: Self-pay | Admitting: *Deleted

## 2019-10-24 ENCOUNTER — Encounter: Payer: Self-pay | Admitting: Family Medicine

## 2019-10-24 DIAGNOSIS — N898 Other specified noninflammatory disorders of vagina: Secondary | ICD-10-CM

## 2019-10-24 DIAGNOSIS — O0990 Supervision of high risk pregnancy, unspecified, unspecified trimester: Secondary | ICD-10-CM | POA: Insufficient documentation

## 2019-10-24 DIAGNOSIS — O09292 Supervision of pregnancy with other poor reproductive or obstetric history, second trimester: Secondary | ICD-10-CM | POA: Insufficient documentation

## 2019-10-24 DIAGNOSIS — O09899 Supervision of other high risk pregnancies, unspecified trimester: Secondary | ICD-10-CM | POA: Insufficient documentation

## 2019-10-24 DIAGNOSIS — O09299 Supervision of pregnancy with other poor reproductive or obstetric history, unspecified trimester: Secondary | ICD-10-CM

## 2019-10-24 MED ORDER — TERCONAZOLE 0.8 % VA CREA: 1.0000 | TOPICAL_CREAM | Freq: Every day | VAGINAL | 0 refills | Status: AC

## 2019-10-24 NOTE — Progress Notes (Signed)
Came to office today for new ob intake ; didn't realize was scheduled as telephone visit. Informed her I can do in person and started visit.   C/o vaginal itching and thick white vaginal discharge. Request RX. Terconazole sent in per protocol. Request refill on reglan, states she ran out and it did help with nausea. Refill request sent to Dr. Adrian Blackwater.  I explained I am completing her New OB Intake today. We discussed Her EDD and that it is based on  Early [place] from MAU visit . I reviewed her allergies, meds, OB History, Medical /Surgical history, and appropriate screenings. I informed her of [facility] services.  I explained I will send her the [employer] app and app was sent to her during the visit. She was able to download app.  I explained we will send a blood pressure cuff to [facility] that will fill that prescription once she has active Medicaid. She currently has [employer] and is going to apply to change to Pregnancy medicaid.  Explained Once she gets Medicaid changed to let us know and we will order a blood pressure cuff prescription and   then we will have her take her blood pressure weekly and enter into the app. I explained she will have some visits in office and some virtually. She already has [employer]. I reviewed her new ob  appointment date/ time with her , our location and to wear mask, no visitors.  I explained she will have a pelvic exam, ob bloodwork, hemoglobin a1C, cbg , and  genetic testing if desired,- she is undecided about  a panorama. I scheduled an Korea at 19 weeks and gave her the appointment. She voices understanding.  Alynna Hargrove,RN

## 2019-10-24 NOTE — Patient Instructions (Signed)

## 2019-10-25 ENCOUNTER — Encounter: Payer: Self-pay | Admitting: *Deleted

## 2019-10-25 ENCOUNTER — Other Ambulatory Visit: Payer: Self-pay | Admitting: Family Medicine

## 2019-10-25 ENCOUNTER — Other Ambulatory Visit: Payer: Self-pay | Admitting: *Deleted

## 2019-10-25 DIAGNOSIS — O219 Vomiting of pregnancy, unspecified: Secondary | ICD-10-CM

## 2019-10-25 MED ORDER — METOCLOPRAMIDE HCL 10 MG PO TABS: 10.0000 mg | ORAL_TABLET | Freq: Four times a day (QID) | ORAL | 0 refills | Status: DC

## 2019-10-25 NOTE — Progress Notes (Signed)
Chart reviewed - agree with CMA/RN documentation.  ° °

## 2019-10-29 ENCOUNTER — Encounter: Payer: Medicaid Other | Admitting: Family Medicine

## 2019-11-19 ENCOUNTER — Telehealth (HOSPITAL_COMMUNITY): Payer: Self-pay | Admitting: *Deleted

## 2019-11-19 ENCOUNTER — Other Ambulatory Visit (HOSPITAL_COMMUNITY)
Admission: RE | Admit: 2019-11-19 | Discharge: 2019-11-19 | Disposition: A | Discharge status: Home / Self Care | Payer: Medicaid Other | Source: Ambulatory Visit | Attending: Family Medicine | Admitting: Family Medicine

## 2019-11-19 ENCOUNTER — Other Ambulatory Visit: Payer: Self-pay

## 2019-11-19 ENCOUNTER — Encounter: Payer: Self-pay | Admitting: Family Medicine

## 2019-11-19 ENCOUNTER — Other Ambulatory Visit (HOSPITAL_COMMUNITY)
Admission: RE | Admit: 2019-11-19 | Discharge: 2019-11-19 | Disposition: A | Discharge status: Home / Self Care | Payer: Medicaid Other | Source: Ambulatory Visit | Attending: Obstetrics and Gynecology | Admitting: Obstetrics and Gynecology

## 2019-11-19 ENCOUNTER — Ambulatory Visit (INDEPENDENT_AMBULATORY_CARE_PROVIDER_SITE_OTHER): Payer: Self-pay | Admitting: Family Medicine

## 2019-11-19 VITALS — BP 123/76 | HR 73 | Wt 180.9 lb

## 2019-11-19 DIAGNOSIS — N883 Incompetence of cervix uteri: Secondary | ICD-10-CM

## 2019-11-19 DIAGNOSIS — Z3A15 15 weeks gestation of pregnancy: Secondary | ICD-10-CM

## 2019-11-19 DIAGNOSIS — Z20822 Contact with and (suspected) exposure to covid-19: Secondary | ICD-10-CM | POA: Insufficient documentation

## 2019-11-19 DIAGNOSIS — O99891 Other specified diseases and conditions complicating pregnancy: Secondary | ICD-10-CM

## 2019-11-19 DIAGNOSIS — N898 Other specified noninflammatory disorders of vagina: Secondary | ICD-10-CM | POA: Insufficient documentation

## 2019-11-19 DIAGNOSIS — Z98891 History of uterine scar from previous surgery: Secondary | ICD-10-CM

## 2019-11-19 DIAGNOSIS — O0990 Supervision of high risk pregnancy, unspecified, unspecified trimester: Secondary | ICD-10-CM

## 2019-11-19 DIAGNOSIS — O3432 Maternal care for cervical incompetence, second trimester: Secondary | ICD-10-CM

## 2019-11-19 DIAGNOSIS — O34219 Maternal care for unspecified type scar from previous cesarean delivery: Secondary | ICD-10-CM

## 2019-11-19 DIAGNOSIS — Z01812 Encounter for preprocedural laboratory examination: Secondary | ICD-10-CM | POA: Insufficient documentation

## 2019-11-19 MED ORDER — FLUCONAZOLE 150 MG PO TABS: 150.0000 mg | ORAL_TABLET | Freq: Once | ORAL | 0 refills | Status: AC

## 2019-11-19 NOTE — Telephone Encounter (Signed)
Npo p MN Arrive at 0900 at [facility] No medications.  Verbalized understanding.  covid test completed.

## 2019-11-19 NOTE — Progress Notes (Addendum)
Subjective:  Tamara Cowan is a [id number] [redacted]w[redacted]d by 6wk Korea, being seen today for her first obstetrical visit.  Her obstetrical history is significant for history of cervical insufficiency - had a midtrimester loss with first pregnancy. Second pregnancy, had cerclage placed, but had preterm labor and presented with bulging membranes and it was assumed that the cerclage had come out. Had cesarean delivery due to footling breech. Cerclage vizualized during annual exam following delivery. Knot was cut off, but suture unable to be removed.. Patient does intend to breast feed. Pregnancy history fully reviewed.  Patient reports vaginal irritation.  BP 123/76   Pulse 73   Wt 180 lb 14.4 oz (82.1 kg)   LMP  (LMP Unknown)   BMI 28.33 kg/m   HISTORY: OB History  Gravida Para Term Preterm AB Living  4 2   2 1 1   SAB TAB Ectopic Multiple Live Births  0 1 0 0 2    # Outcome Date GA Lbr Len/2nd Weight Sex Delivery Anes PTL Lv  4 Current           3 Preterm 11/17/16 [redacted]w[redacted]d  2 lb 1.9 oz (0.96 kg) M CS-LTranv Spinal  LIV     Birth Comments: incompetent cervix,cerclage , preterm labor  2 Preterm 02/11/09 [redacted]w[redacted]d      Y ND     Birth Comments: incompetent cervix  1 TAB 2008            Past Medical History:  Diagnosis Date  . Headache   . Preterm labor   . UTI (urinary tract infection) 2010   during pregnancy    Past Surgical History:  Procedure Laterality Date  . CERVICAL CERCLAGE N/A 09/11/2016   Procedure: CERCLAGE CERVICAL;  Surgeon: Woodroe Mode, MD;  Location: [facility];  Service: Gynecology;  Laterality: N/A;  . CESAREAN SECTION N/A 11/17/2016   Procedure: CESAREAN SECTION;  Surgeon: Aletha Halim, MD;  Location: [facility];  Service: Obstetrics;  Laterality: N/A;    Family History  Problem Relation Age of Onset  . Hypertension Father   . Heart disease Father        has a pacemaker     Exam  BP 123/76   Pulse 73   Wt 180 lb 14.4 oz (82.1 kg)   LMP  (LMP Unknown)   BMI  28.33 kg/m   Chaperone present during exam  CONSTITUTIONAL: Well-developed, well-nourished female in no acute distress.  HENT:  Normocephalic, atraumatic, External right and left ear normal. Oropharynx is clear and moist EYES: Conjunctivae and EOM are normal. Pupils are equal, round, and reactive to light. No scleral icterus.  NECK: Normal range of motion, supple, no masses.  Normal thyroid.  CARDIOVASCULAR: Normal heart rate noted, regular rhythm RESPIRATORY: Clear to auscultation bilaterally. Effort and breath sounds normal, no problems with respiration noted. BREASTS: deferred ABDOMEN: Soft, normal bowel sounds, no distention noted.  No tenderness, rebound or guarding.  PELVIC: Normal appearing external genitalia; normal appearing vaginal mucosa and cervix. Clumpy white discharge noted. Normal uterine size, no other palpable masses, no uterine or adnexal tenderness. MUSCULOSKELETAL: Normal range of motion. No tenderness.  No cyanosis, clubbing, or edema.  2+ distal pulses. SKIN: Skin is warm and dry. No rash noted. Not diaphoretic. No erythema. No pallor. NEUROLOGIC: Alert and oriented to person, place, and time. Normal reflexes, muscle tone coordination. No cranial nerve deficit noted. PSYCHIATRIC: Normal mood and affect. Normal behavior. Normal judgment and thought content.    Assessment:  Pregnancy: [id number] Patient Active Problem List   Diagnosis Date Noted  . Supervision of high risk pregnancy, antepartum 10/24/2019  . History of preterm delivery, currently pregnant 10/24/2019  . History of cervical incompetence in pregnancy, currently pregnant 10/24/2019  . Status post C-section 11/20/2016  . Breech presentation at birth 11/20/2016  . Threatened preterm labor, second trimester 11/15/2016  . Cervical cerclage suture present in second trimester 10/20/2016  . 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 ASCUS with positive high risk HPV cervical 09/08/2016      Plan:   1.  Supervision of high risk pregnancy, antepartum FHT and FH normal.  PNL screening - Hemoglobin A1c - Genetic Screening - Obstetric Panel, Including HIV - Culture, OB Urine - Hepatitis C Antibody - Cervicovaginal ancillary only( [place])  2. Vaginal discharge - Cervicovaginal ancillary only( [place])  3. Status post C-section Desires TOLAC if possible  4. Incompetent cervix I discussed with Dr Parke Poisson, MFM. Recommended placing another cerclage and starting makena. Patient amenable. Will schedule   Problem list reviewed and updated. 75% of 30 min visit spent on counseling and coordination of care.    Levie Heritage 11/19/2019

## 2019-11-20 ENCOUNTER — Encounter (HOSPITAL_COMMUNITY): Payer: Self-pay | Admitting: Obstetrics and Gynecology

## 2019-11-20 LAB — SARS CORONAVIRUS 2 (TAT 6-24 HRS): SARS Coronavirus 2: NEGATIVE

## 2019-11-20 LAB — OBSTETRIC PANEL, INCLUDING HIV
Antibody Screen: NEGATIVE
Basophils Absolute: 0 10*3/uL (ref 0.0–0.2)
Basos: 0 %
EOS (ABSOLUTE): 0.2 10*3/uL (ref 0.0–0.4)
Eos: 3 %
HIV Screen 4th Generation wRfx: NONREACTIVE
Hematocrit: 33.5 % — ABNORMAL LOW (ref 34.0–46.6)
Hemoglobin: 11.2 g/dL (ref 11.1–15.9)
Hepatitis B Surface Ag: NEGATIVE
Immature Grans (Abs): 0 10*3/uL (ref 0.0–0.1)
Immature Granulocytes: 1 %
Lymphocytes Absolute: 1.2 10*3/uL (ref 0.7–3.1)
Lymphs: 16 %
MCH: 27.6 pg (ref 26.6–33.0)
MCHC: 33.4 g/dL (ref 31.5–35.7)
MCV: 83 fL (ref 79–97)
Monocytes Absolute: 1 10*3/uL — ABNORMAL HIGH (ref 0.1–0.9)
Monocytes: 13 %
Neutrophils Absolute: 5 10*3/uL (ref 1.4–7.0)
Neutrophils: 67 %
Platelets: 209 10*3/uL (ref 150–450)
RBC: 4.06 x10E6/uL (ref 3.77–5.28)
RDW: 13.3 % (ref 11.7–15.4)
RPR Ser Ql: NONREACTIVE
Rh Factor: POSITIVE
Rubella Antibodies, IGG: 3.3 index (ref >0.99)
WBC: 7.5 10*3/uL (ref 3.4–10.8)

## 2019-11-20 LAB — CERVICOVAGINAL ANCILLARY ONLY
Bacterial Vaginitis (gardnerella): POSITIVE — AB
Candida Glabrata: NEGATIVE
Candida Vaginitis: POSITIVE — AB
Chlamydia: NEGATIVE
Comment: NEGATIVE
Comment: NEGATIVE
Comment: NEGATIVE
Comment: NEGATIVE
Comment: NEGATIVE
Comment: NORMAL
Neisseria Gonorrhea: NEGATIVE
Trichomonas: NEGATIVE

## 2019-11-20 LAB — HEMOGLOBIN A1C
Est. average glucose Bld gHb Est-mCnc: 103 mg/dL
Hgb A1c MFr Bld: 5.2 % (ref 4.8–5.6)

## 2019-11-20 LAB — HEPATITIS C ANTIBODY: Hep C Virus Ab: <0.1 s/co ratio (ref 0.0–0.9)

## 2019-11-20 NOTE — Anesthesia Preprocedure Evaluation (Addendum)
Anesthesia Evaluation  Patient identified by MRN, date of birth, ID band Patient awake    Reviewed: Allergy & Precautions, NPO status , Patient's Chart, lab work & pertinent test results  Airway Mallampati: I  TM Distance: >3 FB Neck ROM: Full    Dental no notable dental hx. (+) Teeth Intact   Pulmonary former smoker,    Pulmonary exam normal breath sounds clear to auscultation       Cardiovascular negative cardio ROS Normal cardiovascular exam Rhythm:Regular Rate:Normal     Neuro/Psych  Headaches, negative psych ROS   GI/Hepatic negative GI ROS, Neg liver ROS,   Endo/Other  negative endocrine ROS  Renal/GU negative Renal ROS  negative genitourinary   Musculoskeletal negative musculoskeletal ROS (+)   Abdominal   Peds  Hematology negative hematology ROS (+)   Anesthesia Other Findings   Reproductive/Obstetrics (+) Pregnancy 16 weeks AOG Hx/o preterm labor Previous C/Section for Breech presentation                             Anesthesia Physical Anesthesia Plan  ASA: II  Anesthesia Plan: Spinal   Post-op Pain Management:    Induction:   PONV Risk Score and Plan: 3 and Ondansetron and Treatment may vary due to age or medical condition  Airway Management Planned: Natural Airway  Additional Equipment:   Intra-op Plan:   Post-operative Plan:   Informed Consent: I have reviewed the patients History and Physical, chart, labs and discussed the procedure including the risks, benefits and alternatives for the proposed anesthesia with the patient or authorized representative who has indicated his/her understanding and acceptance.     Dental advisory given  Plan Discussed with: CRNA and Surgeon  Anesthesia Plan Comments:        Anesthesia Quick Evaluation

## 2019-11-21 ENCOUNTER — Observation Stay (HOSPITAL_COMMUNITY): Payer: Medicaid Other | Admitting: Anesthesiology

## 2019-11-21 ENCOUNTER — Observation Stay (HOSPITAL_COMMUNITY)
Admission: RE | Admit: 2019-11-21 | Discharge: 2019-11-21 | Disposition: A | Discharge status: Home / Self Care | Payer: Medicaid Other | Attending: Obstetrics and Gynecology | Admitting: Obstetrics and Gynecology

## 2019-11-21 ENCOUNTER — Encounter (HOSPITAL_COMMUNITY)
Admission: RE | Disposition: A | Discharge status: Home / Self Care | Payer: Self-pay | Source: Home / Self Care | Attending: Obstetrics and Gynecology

## 2019-11-21 ENCOUNTER — Other Ambulatory Visit: Payer: Self-pay

## 2019-11-21 ENCOUNTER — Encounter (HOSPITAL_COMMUNITY): Payer: Self-pay | Admitting: Obstetrics and Gynecology

## 2019-11-21 ENCOUNTER — Other Ambulatory Visit: Payer: Self-pay | Admitting: Family Medicine

## 2019-11-21 DIAGNOSIS — Z3A16 16 weeks gestation of pregnancy: Secondary | ICD-10-CM | POA: Diagnosis not present

## 2019-11-21 DIAGNOSIS — O09292 Supervision of pregnancy with other poor reproductive or obstetric history, second trimester: Secondary | ICD-10-CM

## 2019-11-21 DIAGNOSIS — O3432 Maternal care for cervical incompetence, second trimester: Principal | ICD-10-CM | POA: Insufficient documentation

## 2019-11-21 DIAGNOSIS — Z87891 Personal history of nicotine dependence: Secondary | ICD-10-CM | POA: Insufficient documentation

## 2019-11-21 HISTORY — PX: CERVICAL CERCLAGE: SHX1329

## 2019-11-21 LAB — TYPE AND SCREEN
ABO/RH(D): O POS
Antibody Screen: NEGATIVE

## 2019-11-21 LAB — ABO/RH: ABO/RH(D): O POS

## 2019-11-21 LAB — CULTURE, OB URINE

## 2019-11-21 LAB — URINE CULTURE, OB REFLEX

## 2019-11-21 SURGERY — CERCLAGE, CERVIX, VAGINAL APPROACH
Anesthesia: Spinal

## 2019-11-21 MED ORDER — LACTATED RINGERS IV SOLN: INTRAVENOUS | Status: DC | PRN

## 2019-11-21 MED ORDER — METRONIDAZOLE 500 MG PO TABS: 500.0000 mg | ORAL_TABLET | Freq: Two times a day (BID) | ORAL | 0 refills | Status: DC

## 2019-11-21 MED ORDER — OXYCODONE HCL 5 MG PO TABS: 5.0000 mg | ORAL_TABLET | Freq: Once | ORAL | Status: DC | PRN

## 2019-11-21 MED ORDER — LIDOCAINE HCL (PF) 1 % IJ SOLN
INTRAMUSCULAR | Status: AC
  Filled 2019-11-21: qty 30

## 2019-11-21 MED ORDER — ONDANSETRON HCL 4 MG/2ML IJ SOLN: 4.0000 mg | Freq: Once | INTRAMUSCULAR | Status: DC | PRN

## 2019-11-21 MED ORDER — FENTANYL CITRATE (PF) 100 MCG/2ML IJ SOLN
25.0000 ug | INTRAMUSCULAR | Status: DC | PRN
  Administered 2019-11-21: 25 ug via INTRAVENOUS

## 2019-11-21 MED ORDER — FENTANYL CITRATE (PF) 100 MCG/2ML IJ SOLN
INTRAMUSCULAR | Status: AC
  Filled 2019-11-21: qty 2

## 2019-11-21 MED ORDER — OXYCODONE HCL 5 MG/5ML PO SOLN: 5.0000 mg | Freq: Once | ORAL | Status: DC | PRN

## 2019-11-21 MED ORDER — BUPIVACAINE IN DEXTROSE 0.75-8.25 % IT SOLN
INTRATHECAL | Status: DC | PRN
  Administered 2019-11-21 (×2): 1.5 mL via INTRATHECAL

## 2019-11-21 MED ORDER — OXYCODONE-ACETAMINOPHEN 5-325 MG PO TABS: 1.0000 | ORAL_TABLET | Freq: Four times a day (QID) | ORAL | 0 refills | Status: DC | PRN

## 2019-11-21 MED ORDER — LIDOCAINE HCL 1 % IJ SOLN
INTRAMUSCULAR | Status: DC | PRN
  Administered 2019-11-21: 10 mL

## 2019-11-21 SURGICAL SUPPLY — 17 items
CANISTER SUCT 3000ML PPV (MISCELLANEOUS) ×6 IMPLANT
GLOVE BIOGEL PI IND STRL 6.5 (GLOVE) ×1 IMPLANT
GLOVE BIOGEL PI IND STRL 7.0 (GLOVE) ×1 IMPLANT
GLOVE BIOGEL PI INDICATOR 6.5 (GLOVE) ×2
GLOVE BIOGEL PI INDICATOR 7.0 (GLOVE) ×2
GLOVE SURG SS PI 6.5 STRL IVOR (GLOVE) ×3 IMPLANT
GOWN STRL REUS W/TWL LRG LVL3 (GOWN DISPOSABLE) ×6 IMPLANT
NS IRRIG 1000ML POUR BTL (IV SOLUTION) ×3 IMPLANT
PACK VAGINAL MINOR WOMEN LF (CUSTOM PROCEDURE TRAY) ×3 IMPLANT
PAD OB MATERNITY 4.3X12.25 (PERSONAL CARE ITEMS) ×3 IMPLANT
PAD PREP 24X48 CUFFED NSTRL (MISCELLANEOUS) ×3 IMPLANT
SUT PROLENE 0 CT 1 30 (SUTURE) ×3 IMPLANT
TOWEL OR 17X24 6PK STRL BLUE (TOWEL DISPOSABLE) ×6 IMPLANT
TRAY FOLEY W/BAG SLVR 14FR (SET/KITS/TRAYS/PACK) ×3 IMPLANT
TUBING NON-CON 1/4 X 20 CONN (TUBING) ×2 IMPLANT
TUBING NON-CON 1/4 X 20' CONN (TUBING) ×1
YANKAUER SUCT BULB TIP NO VENT (SUCTIONS) ×3 IMPLANT

## 2019-11-21 NOTE — Anesthesia Procedure Notes (Addendum)
Spinal  Patient location during procedure: OR Start time: 11/21/2019 11:09 AM End time: 11/21/2019 11:11 AM Staffing Performed: anesthesiologist  Anesthesiologist: Mal Amabile, MD Preanesthetic Checklist Completed: patient identified, IV checked, site marked, risks and benefits discussed, surgical consent, monitors and equipment checked, pre-op evaluation and timeout performed Spinal Block Patient position: sitting Prep: DuraPrep and site prepped and draped Patient monitoring: heart rate, cardiac monitor, continuous pulse ox and blood pressure Approach: midline Location: L3-4 Injection technique: single-shot Needle Needle type: Pencan  Needle gauge: 24 G Needle length: 9 cm Needle insertion depth: 6 cm Assessment Sensory level: T8 Additional Notes Patient tolerated procedure well. Adequate sensory level.

## 2019-11-21 NOTE — Op Note (Signed)
Tamara Cowan   PROCEDURE DATE: 11/21/2019  PREOPERATIVE DIAGNOSIS: Intrauterine pregnancy at [redacted]w[redacted]d, history of cervical incompetence   POSTOPERATIVE DIAGNOSIS: The same PROCEDURE: Transvaginal McDonald Cervical Cerclage Placement SURGEON:  Dr. Catalina Antigua  INDICATIONS: 28 y.o. [id number] at [redacted]w[redacted]d with history of cervical incompetence, here for cerclage placement.   The risks of surgery were discussed in detail with the patient including but not limited to: bleeding; infection which may require antibiotic therapy; injury to cervix, vagina other surrounding organs; risk of ruptured membranes and/or preterm delivery and other postoperative or anesthesia complications.  Written informed consent was obtained.    FINDINGS:  About 2 cm palpable cervical length in the vagina, closed cervix, suture knot placed anteriorly.  ANESTHESIA:  Spinal INTRAVENOUS FLUIDS: 800  ml ESTIMATED BLOOD LOSS: 10 ml URINE OUTPUT: 200 ml COMPLICATIONS: None immediate  PROCEDURE IN DETAIL:  The patient received intravenous antibiotics and had sequential compression devices applied to her lower extremities while in the preoperative area.  Reassuring fetal heart rate was also obtained using a doppler. She was then taken to the operating room where spinal anesthesia was administered and was found to be adequate.  She was placed in the dorsal lithotomy, and was prepped and draped in a sterile manner. Her bladder was catheterized.  After an adequate timeout was performed, a vaginal speculum was then placed in the patient's vagina and a single tooth tenaculum was applied to the anterior lip of the cervix.  A paracervical block using 1% Marcaine was administered.  The anterior and posterior lips of the cervix was grasped with ring forceps. A curved needle loaded with a number 1 Prolene suture was inserted at 12 o'clock, as high as possible at the junction of the rugated vaginal epithelium and the smooth cervix, at least 2 cm above the  external os.  Four bites are taken circumferentially around the entire cervix in a purse-string fashion, each bite should be deep enough to extend at least midway into the cervical stroma, but not into the endocervical canal. The two ends of the suture were then tied securely anteriorly and cut, leaving the ends long enough to grasp with a clamp when it is time to remove it. There was minimal bleeding noted and the ring forceps were removed with good hemostasis noted.  All instruments were removed from the patient's vagina.  Instrument, needle and sponge counts were correct x 2. The patient tolerated the procedure well, and was taken to the recovery area awake and in stable condition. Reassuring fetal heart rate was also obtained using a doppler in the recovery area.  The patient will be discharged to home as per PACU criteria.  Routine postoperative instructions given.  She was prescribed Percocet and Colace.  She will follow up in the clinic for postoperative evaluation and ongoing prenatal care

## 2019-11-21 NOTE — Transfer of Care (Signed)
Immediate Anesthesia Transfer of Care Note  Patient: Tamara Cowan  Procedure(s) Performed: CERCLAGE CERVICAL (N/A )  Patient Location: PACU  Anesthesia Type:Spinal  Level of Consciousness: awake, alert  and oriented  Airway & Oxygen Therapy: Patient Spontanous Breathing  Post-op Assessment: Report given to RN and Post -op Vital signs reviewed and stable  Post vital signs: Reviewed and stable  Last Vitals:  Vitals Value Taken Time  BP    Temp    Pulse    Resp    SpO2      Last Pain:  Vitals:   11/21/19 1000  TempSrc: Oral         Complications: No apparent anesthesia complications

## 2019-11-21 NOTE — Anesthesia Procedure Notes (Signed)
Spinal  Patient location during procedure: OR Start time: 11/21/2019 10:58 AM End time: 11/21/2019 11:00 AM Preanesthetic Checklist Completed: patient identified, IV checked, risks and benefits discussed, surgical consent, monitors and equipment checked, pre-op evaluation and timeout performed Spinal Block Patient position: sitting Prep: alcohol swabs and DuraPrep Patient monitoring: blood pressure and continuous pulse ox Approach: midline Location: L4-5 Injection technique: single-shot Needle Needle type: Pencan  Needle gauge: 24 G Needle length: 12.7 cm Additional Notes FF CSF, + clear aspirate prior to injection

## 2019-11-21 NOTE — Discharge Instructions (Signed)
Cervical Cerclage, Care After This sheet gives you information about how to care for yourself after your procedure. Your health care provider may also give you more specific instructions. If you have problems or questions, contact your health care provider. What can I expect after the procedure? After your procedure, it is common to have:  Cramping in your abdomen.  Mucus discharge from your vagina. This may last for several days.  Painful urination (dysuria).  Spotting, or small drops of blood coming from your vagina. Follow these instructions at home:  Medicines  Take over-the-counter and prescription medicines only as told by your health care provider.  Ask your health care provider if the medicine prescribed to you requires you to avoid driving or using heavy machinery. General instructions  If you are told to go on bed rest, follow instructions from your health care provider. You may need to be on bed rest for up to 3 days.  Keep track of your vaginal discharge and watch for any changes. If you notice changes, tell your health care provider.  Avoid physical activities and exercise until your health care provider approves. Ask your health care provider what activities are safe for you.  Do not douche or have sex until your health care provider says it is okay to do so.  Keep all follow-up visits, including prenatal visits, as told by your health care provider. This is important. ? Prenatal visits are all the care that you receive before the birth of your baby. ? You may also need an ultrasound.  You may be asked to have weekly visits to have your cervix checked. Contact a health care provider if you:  Have abnormal discharge from your vagina, such as clots.  Have a bad-smelling discharge from your vagina.  Develop a rash on your skin. This may look like redness and swelling.  Become light-headed or feel like you are going to faint.  Have abdominal pain that does not  get better with medicine.  Have nausea or vomiting that does not go away. Get help right away if you:  Have vaginal bleeding that is heavier or more frequent than spotting.  Are leaking fluid or your water breaks.  Have a fever or chills.  Faint.  Have uterine contractions. These may feel like: ? A back ache. ? Lower abdominal pain. ? Mild cramps, similar to menstrual cramps. ? Tightening or pressure in your abdomen.  Think that your baby is not moving as much as usual, or you cannot feel your baby move.  Have chest pain.  Have shortness of breath. Summary  After the procedure, it is common to have cramping, vaginal discharge, painful urination, and small drops of blood coming from your vagina.  If you are told to go on bed rest, follow instructions from your health care provider. You may need to be on bed rest for up to 3 days.  Keep track of your vaginal discharge and watch for any changes. If you notice changes, tell your health care provider.  Contact a health care provider if you have abnormal vaginal discharge, become light-headed, or have pain that cannot be controlled with medicines.  Get help right away if you have heavy vaginal bleeding, your water breaks, or you have uterine contractions. Also, get help right away if your baby is not moving as much as usual, or you have chest pain or shortness of breath. This information is not intended to replace advice given to you by your health care provider.   Make sure you discuss any questions you have with your health care provider. Document Revised: 04/17/2019 Document Reviewed: 02/14/2019 Elsevier Patient Education  2020 [employer]  

## 2019-11-21 NOTE — H&P (Signed)
Tamara Cowan is a 29 y.o. female P30 at [redacted]w[redacted]d presenting for scheduled cervical cerclage. Patient with history of cervical insufficiency and had a cerclage with her last pregnancy which resulted in preterm delivery due to PPROM. Patient is without complaints today. She denies vaginal bleeding or lower abdominal cramping pain.   OB History    Gravida  4   Para  2   Term      Preterm  2   AB  1   Living  1     SAB  0   TAB  1   Ectopic  0   Multiple  0   Live Births  2          Past Medical History:  Diagnosis Date  . Headache   . Preterm labor   . UTI (urinary tract infection) 2010   during pregnancy   Past Surgical History:  Procedure Laterality Date  . CERVICAL CERCLAGE N/A 09/11/2016   Procedure: CERCLAGE CERVICAL;  Surgeon: Tamara Phenix, MD;  Location: [facility];  Service: Gynecology;  Laterality: N/A;  . CESAREAN SECTION N/A 11/17/2016   Procedure: CESAREAN SECTION;  Surgeon: Tamara Bing, MD;  Location: [facility] BIRTHING SUITES;  Service: Obstetrics;  Laterality: N/A;   Family History: family history includes Heart disease in her father; Hypertension in her father. Social History:  reports that she quit smoking about 12 months ago. Her smoking use included cigarettes. She quit after 0.50 years of use. She has never used smokeless tobacco. She reports previous alcohol use of about 1.0 standard drinks of alcohol per week. She reports that she does not use drugs.     Maternal Diabetes: No Genetic Screening: pending Maternal Ultrasounds/Referrals:  Fetal Ultrasounds or other Referrals:  None Maternal Substance Abuse:  No Significant Maternal Medications:  None Significant Maternal Lab Results:  None Other Comments:    Review of Systems History  See pertinent in HPI Blood pressure 124/79, pulse 78, temperature 98.8 F (37.1 C), temperature source Oral, resp. rate 18, height 5\' 7"  (1.702 m), weight 81.6 kg, SpO2 100 %, unknown if currently  breastfeeding. Exam Physical Exam  GENERAL: Well-developed, well-nourished female in no acute distress.  LUNGS: Clear to auscultation bilaterally.  HEART: Regular rate and rhythm. ABDOMEN: Soft, nontender, nondistended. No organomegaly. PELVIC: Deferred to OR EXTREMITIES: No cyanosis, clubbing, or edema, 2+ distal pulses.   Prenatal labs: ABO, Rh: O/Positive/-- (05/17 1051) Antibody: Negative (05/17 1051) Rubella: 3.30 (05/17 1051) RPR: Non Reactive (05/17 1051)  HBsAg: Negative (05/17 1051)  HIV: Non Reactive (05/17 1051)  GBS:     Assessment/Plan: 29 yo P0211 at [redacted]w[redacted]d with history of incompetent cervix here for scheduled cerclage placement - Risks, benefits and alternatives were explained including but not limited to risks of bleeding, infection, damage to adjacent organs and rupture of membrance - Patient verbalized understanding and all questions were answered   Tamara Cowan 11/21/2019, 10:41 AM

## 2019-11-21 NOTE — Anesthesia Postprocedure Evaluation (Signed)
Anesthesia Post Note  Patient: Secretary/administrator  Procedure(s) Performed: CERCLAGE CERVICAL (N/A )     Patient location during evaluation: PACU Anesthesia Type: Spinal Level of consciousness: oriented and awake and alert Pain management: pain level controlled Vital Signs Assessment: post-procedure vital signs reviewed and stable Respiratory status: spontaneous breathing, respiratory function stable and nonlabored ventilation Cardiovascular status: blood pressure returned to baseline and stable Postop Assessment: no headache, no backache, no apparent nausea or vomiting and spinal receding Anesthetic complications: no    Last Vitals:  Vitals:   11/21/19 1200 11/21/19 1215  BP: (!) 95/54 (!) 97/52  Pulse: 77   Resp: 13 11  Temp:    SpO2: 91%     Last Pain:  Vitals:   11/21/19 1142  TempSrc: Oral   Pain Goal:    LLE Motor Response: No movement due to regional block (11/21/19 1215) LLE Sensation: Numbness (11/21/19 1215) RLE Motor Response: No movement due to regional block (11/21/19 1215) RLE Sensation: Numbness (11/21/19 1215)     Epidural/Spinal Function Cutaneous sensation: No Sensation (11/21/19 1215), Patient able to flex knees: No (11/21/19 1215), Patient able to lift hips off bed: No (11/21/19 1215), Back pain beyond tenderness at insertion site: No (11/21/19 1215), Progressively worsening motor and/or sensory loss: No (11/21/19 1215), Bowel and/or bladder incontinence post epidural: No (11/21/19 1215)  FOSTER,MICHAEL A.

## 2019-11-22 ENCOUNTER — Encounter: Payer: Self-pay | Admitting: *Deleted

## 2019-11-23 ENCOUNTER — Encounter: Payer: Self-pay | Admitting: *Deleted

## 2019-11-27 ENCOUNTER — Inpatient Hospital Stay (HOSPITAL_BASED_OUTPATIENT_CLINIC_OR_DEPARTMENT_OTHER): Payer: Medicaid Other

## 2019-11-27 ENCOUNTER — Encounter (HOSPITAL_COMMUNITY): Payer: Self-pay | Admitting: Obstetrics and Gynecology

## 2019-11-27 ENCOUNTER — Inpatient Hospital Stay (HOSPITAL_COMMUNITY)
Admission: AD | Admit: 2019-11-27 | Discharge: 2019-11-28 | DRG: 779 | Disposition: A | Discharge status: Home / Self Care | Payer: Medicaid Other | Attending: Obstetrics and Gynecology | Admitting: Obstetrics and Gynecology

## 2019-11-27 ENCOUNTER — Other Ambulatory Visit: Payer: Self-pay

## 2019-11-27 DIAGNOSIS — R109 Unspecified abdominal pain: Secondary | ICD-10-CM

## 2019-11-27 DIAGNOSIS — O3432 Maternal care for cervical incompetence, second trimester: Secondary | ICD-10-CM | POA: Diagnosis present

## 2019-11-27 DIAGNOSIS — Z98891 History of uterine scar from previous surgery: Secondary | ICD-10-CM

## 2019-11-27 DIAGNOSIS — O09899 Supervision of other high risk pregnancies, unspecified trimester: Secondary | ICD-10-CM

## 2019-11-27 DIAGNOSIS — Z3A17 17 weeks gestation of pregnancy: Secondary | ICD-10-CM

## 2019-11-27 DIAGNOSIS — O99891 Other specified diseases and conditions complicating pregnancy: Secondary | ICD-10-CM

## 2019-11-27 DIAGNOSIS — O09212 Supervision of pregnancy with history of pre-term labor, second trimester: Secondary | ICD-10-CM

## 2019-11-27 DIAGNOSIS — O021 Missed abortion: Principal | ICD-10-CM

## 2019-11-27 DIAGNOSIS — O3680X0 Pregnancy with inconclusive fetal viability, not applicable or unspecified: Secondary | ICD-10-CM

## 2019-11-27 DIAGNOSIS — O09292 Supervision of pregnancy with other poor reproductive or obstetric history, second trimester: Secondary | ICD-10-CM

## 2019-11-27 DIAGNOSIS — O864 Pyrexia of unknown origin following delivery: Secondary | ICD-10-CM

## 2019-11-27 LAB — URINALYSIS, ROUTINE W REFLEX MICROSCOPIC
Bilirubin Urine: NEGATIVE
Glucose, UA: NEGATIVE mg/dL
Hgb urine dipstick: NEGATIVE
Ketones, ur: NEGATIVE mg/dL
Nitrite: NEGATIVE
Protein, ur: NEGATIVE mg/dL
Specific Gravity, Urine: 1.018 (ref 1.005–1.030)
pH: 7 (ref 5.0–8.0)

## 2019-11-27 LAB — CBC WITH DIFFERENTIAL/PLATELET
Abs Immature Granulocytes: 0.05 10*3/uL (ref 0.00–0.07)
Basophils Absolute: 0 10*3/uL (ref 0.0–0.1)
Basophils Relative: 0 %
Eosinophils Absolute: 0.1 10*3/uL (ref 0.0–0.5)
Eosinophils Relative: 1 %
HCT: 33.3 % — ABNORMAL LOW (ref 36.0–46.0)
Hemoglobin: 10.8 g/dL — ABNORMAL LOW (ref 12.0–15.0)
Immature Granulocytes: 0 %
Lymphocytes Relative: 8 %
Lymphs Abs: 0.9 10*3/uL (ref 0.7–4.0)
MCH: 27.3 pg (ref 26.0–34.0)
MCHC: 32.4 g/dL (ref 30.0–36.0)
MCV: 84.3 fL (ref 80.0–100.0)
Monocytes Absolute: 0.8 10*3/uL (ref 0.1–1.0)
Monocytes Relative: 7 %
Neutro Abs: 9.6 10*3/uL — ABNORMAL HIGH (ref 1.7–7.7)
Neutrophils Relative %: 84 %
Platelets: 181 10*3/uL (ref 150–400)
RBC: 3.95 MIL/uL (ref 3.87–5.11)
RDW: 13.7 % (ref 11.5–15.5)
WBC: 11.5 10*3/uL — ABNORMAL HIGH (ref 4.0–10.5)
nRBC: 0 % (ref 0.0–0.2)

## 2019-11-27 LAB — WET PREP, GENITAL
Clue Cells Wet Prep HPF POC: NONE SEEN
Sperm: NONE SEEN
Trich, Wet Prep: NONE SEEN

## 2019-11-27 LAB — CBC
HCT: 34 % — ABNORMAL LOW (ref 36.0–46.0)
Hemoglobin: 10.9 g/dL — ABNORMAL LOW (ref 12.0–15.0)
MCH: 27.3 pg (ref 26.0–34.0)
MCHC: 32.1 g/dL (ref 30.0–36.0)
MCV: 85.2 fL (ref 80.0–100.0)
Platelets: 197 10*3/uL (ref 150–400)
RBC: 3.99 MIL/uL (ref 3.87–5.11)
RDW: 13.8 % (ref 11.5–15.5)
WBC: 12.3 10*3/uL — ABNORMAL HIGH (ref 4.0–10.5)
nRBC: 0 % (ref 0.0–0.2)

## 2019-11-27 LAB — TYPE AND SCREEN
ABO/RH(D): O POS
Antibody Screen: NEGATIVE

## 2019-11-27 MED ORDER — FLUCONAZOLE 150 MG PO TABS
150.0000 mg | ORAL_TABLET | Freq: Once | ORAL | Status: AC
  Administered 2019-11-27: 150 mg via ORAL
  Filled 2019-11-27: qty 1

## 2019-11-27 MED ORDER — OXYCODONE-ACETAMINOPHEN 5-325 MG PO TABS: 2.0000 | ORAL_TABLET | ORAL | Status: DC | PRN

## 2019-11-27 MED ORDER — DOCUSATE SODIUM 100 MG PO CAPS: 100.0000 mg | ORAL_CAPSULE | Freq: Every day | ORAL | Status: DC

## 2019-11-27 MED ORDER — BENZOCAINE-MENTHOL 20-0.5 % EX AERO: 1.0000 "application " | INHALATION_SPRAY | CUTANEOUS | Status: DC | PRN

## 2019-11-27 MED ORDER — PRENATAL MULTIVITAMIN CH: 1.0000 | ORAL_TABLET | Freq: Every day | ORAL | Status: DC

## 2019-11-27 MED ORDER — DIPHENHYDRAMINE HCL 25 MG PO CAPS: 25.0000 mg | ORAL_CAPSULE | Freq: Four times a day (QID) | ORAL | Status: DC | PRN

## 2019-11-27 MED ORDER — WITCH HAZEL-GLYCERIN EX PADS: 1.0000 "application " | MEDICATED_PAD | CUTANEOUS | Status: DC | PRN

## 2019-11-27 MED ORDER — IBUPROFEN 600 MG PO TABS
600.0000 mg | ORAL_TABLET | Freq: Four times a day (QID) | ORAL | Status: DC
  Administered 2019-11-27 – 2019-11-28 (×2): 600 mg via ORAL
  Filled 2019-11-27 (×2): qty 1

## 2019-11-27 MED ORDER — FENTANYL CITRATE (PF) 100 MCG/2ML IJ SOLN
100.0000 ug | INTRAMUSCULAR | Status: DC | PRN
  Administered 2019-11-27 (×3): 100 ug via INTRAVENOUS
  Filled 2019-11-27 (×3): qty 2

## 2019-11-27 MED ORDER — DIBUCAINE (PERIANAL) 1 % EX OINT: 1.0000 "application " | TOPICAL_OINTMENT | CUTANEOUS | Status: DC | PRN

## 2019-11-27 MED ORDER — ONDANSETRON HCL 4 MG PO TABS: 4.0000 mg | ORAL_TABLET | ORAL | Status: DC | PRN

## 2019-11-27 MED ORDER — CALCIUM CARBONATE ANTACID 500 MG PO CHEW: 2.0000 | CHEWABLE_TABLET | ORAL | Status: DC | PRN

## 2019-11-27 MED ORDER — COCONUT OIL OIL: 1.0000 "application " | TOPICAL_OIL | Status: DC | PRN

## 2019-11-27 MED ORDER — OXYCODONE-ACETAMINOPHEN 5-325 MG PO TABS: 1.0000 | ORAL_TABLET | ORAL | Status: DC | PRN

## 2019-11-27 MED ORDER — LACTATED RINGERS IV SOLN: INTRAVENOUS | Status: DC

## 2019-11-27 MED ORDER — SODIUM CHLORIDE 0.9 % IV SOLN
100.0000 mg | Freq: Two times a day (BID) | INTRAVENOUS | Status: AC
  Administered 2019-11-27 – 2019-11-28 (×2): 100 mg via INTRAVENOUS
  Filled 2019-11-27 (×2): qty 100

## 2019-11-27 MED ORDER — ACETAMINOPHEN 325 MG PO TABS: 650.0000 mg | ORAL_TABLET | ORAL | Status: DC | PRN

## 2019-11-27 MED ORDER — ZOLPIDEM TARTRATE 5 MG PO TABS: 5.0000 mg | ORAL_TABLET | Freq: Every evening | ORAL | Status: DC | PRN

## 2019-11-27 MED ORDER — MISOPROSTOL 200 MCG PO TABS
600.0000 ug | ORAL_TABLET | Freq: Four times a day (QID) | ORAL | Status: DC
  Administered 2019-11-27: 600 ug via ORAL
  Filled 2019-11-27: qty 3

## 2019-11-27 MED ORDER — SENNOSIDES-DOCUSATE SODIUM 8.6-50 MG PO TABS
2.0000 | ORAL_TABLET | ORAL | Status: DC
  Administered 2019-11-27: 2 via ORAL
  Filled 2019-11-27: qty 2

## 2019-11-27 MED ORDER — TETANUS-DIPHTH-ACELL PERTUSSIS 5-2.5-18.5 LF-MCG/0.5 IM SUSP: 0.5000 mL | Freq: Once | INTRAMUSCULAR | Status: DC

## 2019-11-27 MED ORDER — ACETAMINOPHEN 325 MG PO TABS
650.0000 mg | ORAL_TABLET | ORAL | Status: DC | PRN
  Administered 2019-11-27: 650 mg via ORAL
  Filled 2019-11-27: qty 2

## 2019-11-27 MED ORDER — ONDANSETRON HCL 4 MG/2ML IJ SOLN: 4.0000 mg | INTRAMUSCULAR | Status: DC | PRN

## 2019-11-27 MED ORDER — BUTALBITAL-APAP-CAFFEINE 50-325-40 MG PO TABS: 1.0000 | ORAL_TABLET | ORAL | Status: DC | PRN

## 2019-11-27 MED ORDER — SIMETHICONE 80 MG PO CHEW: 80.0000 mg | CHEWABLE_TABLET | ORAL | Status: DC | PRN

## 2019-11-27 MED ORDER — METRONIDAZOLE IN NACL 5-0.79 MG/ML-% IV SOLN
500.0000 mg | Freq: Three times a day (TID) | INTRAVENOUS | Status: AC
  Administered 2019-11-27 – 2019-11-28 (×2): 500 mg via INTRAVENOUS
  Filled 2019-11-27 (×4): qty 100

## 2019-11-27 MED ORDER — IBUPROFEN 800 MG PO TABS
800.0000 mg | ORAL_TABLET | Freq: Three times a day (TID) | ORAL | Status: DC | PRN
  Administered 2019-11-27: 800 mg via ORAL
  Filled 2019-11-27: qty 1

## 2019-11-27 NOTE — Progress Notes (Signed)
Delivery of nonviable female fetus and placenta at 2128 with Dr. Adrian Blackwater in room.

## 2019-11-27 NOTE — H&P (Addendum)
OBSTETRIC ADMISSION HISTORY AND PHYSICAL  Tamara Cowan is a 29 y.o. female [phone] with IUP at [redacted]w[redacted]d presenting for IUFD. She had initially presented to MAU for abdominal cramping since yesterday. She reports feeling like she was getting a cold (with body aches and mild malaise), then having a lot of mucous-like discharge. She denies fevers or chills. Reports no nausea, vomiting or diarrhea. The cramping has continued today and she says her discharge has increased. She is feeling a lot of pelvic pressure. She is not able to feel her baby move.  She has a history of preterm delivery, with a 20 week neonatal demise 2/2 incompetent cervix and a C-section at 25 weeks for footling breech presentation after cerclage placement.  Prenatal History/Complications: 2010- Second trimester loss with first pregnancy 2/2 incompetent cervix 2018- Second pregnancy, had cerclage placed, PPROM at 25 weeks footling breech, resulting in CS (low-transverse) ASCUS with +HR HPV  Past Medical History: Past Medical History:  Diagnosis Date  . Headache   . Preterm labor   . UTI (urinary tract infection) 2010   during pregnancy    Past Surgical History: Past Surgical History:  Procedure Laterality Date  . CERVICAL CERCLAGE N/A 09/11/2016   Procedure: CERCLAGE CERVICAL;  Surgeon: Adam Phenix, MD;  Location: [facility];  Service: Gynecology;  Laterality: N/A;  . CERVICAL CERCLAGE N/A 11/21/2019   Procedure: CERCLAGE CERVICAL;  Surgeon: Catalina Antigua, MD;  Location: [facility];  Service: Gynecology;  Laterality: N/A;  . CESAREAN SECTION N/A 11/17/2016   Procedure: CESAREAN SECTION;  Surgeon: Demarest Bing, MD;  Location: [facility] BIRTHING SUITES;  Service: Obstetrics;  Laterality: N/A;    Obstetrical History: OB History    Gravida  4   Para  2   Term      Preterm  2   AB  1   Living  1     SAB  0   TAB  1   Ectopic  0   Multiple  0   Live Births  2           Social History: Social History    Socioeconomic History  . Marital status: Single    Spouse name: Not on file  . Number of children: Not on file  . Years of education: Not on file  . Highest education level: Not on file  Occupational History  . Not on file  Tobacco Use  . Smoking status: Former Smoker    Years: 0.50    Types: Cigarettes    Quit date: 10/24/2018    Years since quitting: 1.0  . Smokeless tobacco: Never Used  . Tobacco comment: 1-2 PER DAY  Substance and Sexual Activity  . Alcohol use: Not Currently    Alcohol/week: 1.0 standard drinks    Types: 1 Glasses of wine per week    Comment: occasionally  . Drug use: No  . Sexual activity: Yes    Birth control/protection: None  Other Topics Concern  . Not on file  Social History Narrative  . Not on file   Social Determinants of Health   Financial Resource Strain:   . Difficulty of Paying Living Expenses:   Food Insecurity: No Food Insecurity  . Worried About Programme researcher, broadcasting/film/video in the Last Year: Never true  . Ran Out of Food in the Last Year: Never true  Transportation Needs: No Transportation Needs  . Lack of Transportation (Medical): No  . Lack of Transportation (Non-Medical): No  Physical Activity:   .  Days of Exercise per Week:   . Minutes of Exercise per Session:   Stress:   . Feeling of Stress :   Social Connections:   . Frequency of Communication with Friends and Family:   . Frequency of Social Gatherings with Friends and Family:   . Attends Religious Services:   . Active Member of Clubs or Organizations:   . Attends Banker Meetings:   Marland Kitchen Marital Status:     Family History: Family History  Problem Relation Age of Onset  . Hypertension Father   . Heart disease Father        has a pacemaker    Allergies: No Known Allergies  Medications Prior to Admission  Medication Sig Dispense Refill Last Dose  . metoCLOPramide (REGLAN) 10 MG tablet Take 1 tablet (10 mg total) by mouth every 6 (six) hours. 30 tablet 0  11/27/2019 at Unknown time  . oxyCODONE-acetaminophen (PERCOCET/ROXICET) 5-325 MG tablet Take 1 tablet by mouth every 6 (six) hours as needed. 10 tablet 0 11/27/2019 at Unknown time  . Prenatal Vit-Fe Fumarate-FA (PRENATAL VITAMINS PO) Take 1 tablet by mouth daily.   11/27/2019 at Unknown time  . acetaminophen (TYLENOL) 500 MG tablet Take 500 mg by mouth every 8 (eight) hours as needed for moderate pain or headache.      . metroNIDAZOLE (FLAGYL) 500 MG tablet Take 1 tablet (500 mg total) by mouth 2 (two) times daily. 14 tablet 0 not taking     Review of Systems:  All systems reviewed and negative except as stated in HPI  PE: Blood pressure 113/62, pulse (!) 106, temperature (!) 100.8 F (38.2 C), temperature source Oral, resp. rate 20, SpO2 100 %, unknown if currently breastfeeding. General appearance: alert and cooperative, appropriately tearful Lungs: regular rate and effort Heart: regular rate  Abdomen: soft, non-tender Extremities: Homans sign is negative, no sign of DVT Presentation: breech  Prenatal labs: ABO, Rh: --/--/PENDING (05/25 1530) Antibody: PENDING (05/25 1530) Rubella: 3.30 (05/17 1051) RPR: Non Reactive (05/17 1051)  HBsAg: Negative (05/17 1051)  HIV: Non Reactive (05/17 1051)  GBS:   unknown 2 hr GTT - not yet performed  Prenatal Transfer Tool  Maternal Diabetes: unknown Genetic Screening: pending Maternal Ultrasounds/Referrals: No cardiac activity Fetal Ultrasounds or other Referrals:  Referred to Materal Fetal Medicine  Maternal Substance Abuse:  No Significant Maternal Medications:  None Significant Maternal Lab Results: None  Results for orders placed or performed during the hospital encounter of 11/27/19 (from the past 24 hour(s))  Urinalysis, Routine w reflex microscopic   Collection Time: 11/27/19  2:18 PM  Result Value Ref Range   Color, Urine YELLOW YELLOW   APPearance CLEAR CLEAR   Specific Gravity, Urine 1.018 1.005 - 1.030   pH 7.0 5.0 - 8.0    Glucose, UA NEGATIVE NEGATIVE mg/dL   Hgb urine dipstick NEGATIVE NEGATIVE   Bilirubin Urine NEGATIVE NEGATIVE   Ketones, ur NEGATIVE NEGATIVE mg/dL   Protein, ur NEGATIVE NEGATIVE mg/dL   Nitrite NEGATIVE NEGATIVE   Leukocytes,Ua MODERATE (A) NEGATIVE   RBC / HPF 0-5 0 - 5 RBC/hpf   WBC, UA 11-20 0 - 5 WBC/hpf   Bacteria, UA RARE (A) NONE SEEN   Squamous Epithelial / LPF 0-5 0 - 5  Wet prep, genital   Collection Time: 11/27/19  2:55 PM   Specimen: Vaginal  Result Value Ref Range   Yeast Wet Prep HPF POC PRESENT (A) NONE SEEN   Trich, Wet Prep NONE SEEN  NONE SEEN   Clue Cells Wet Prep HPF POC NONE SEEN NONE SEEN   WBC, Wet Prep HPF POC MANY (A) NONE SEEN   Sperm NONE SEEN   Type and screen [facility]   Collection Time: 28-Nov-2019  3:30 PM  Result Value Ref Range   ABO/RH(D) PENDING    Antibody Screen PENDING    Sample Expiration      11/30/2019,2359 Performed at [facility], [address], [place], [place] [address]   CBC on admission   Collection Time: 11/28/19  3:35 PM  Result Value Ref Range   WBC 12.3 (H) 4.0 - 10.5 K/uL   RBC 3.99 3.87 - 5.11 MIL/uL   Hemoglobin 10.9 (L) 12.0 - 15.0 g/dL   HCT 96.2 (L) 22.9 - 79.8 %   MCV 85.2 80.0 - 100.0 fL   MCH 27.3 26.0 - 34.0 pg   MCHC 32.1 30.0 - 36.0 g/dL   RDW 92.1 19.4 - 17.4 %   Platelets 197 150 - 400 K/uL   nRBC 0.0 0.0 - 0.2 %    Patient Active Problem List   Diagnosis Date Noted  . Fetal demise before 20 weeks with retention of dead fetus 28-Nov-2019  . Supervision of high risk pregnancy, antepartum 10/24/2019  . History of preterm delivery, currently pregnant 10/24/2019  . History of incompetent cervix, currently pregnant in second trimester 10/24/2019  . Status post C-section 11/20/2016  . Breech presentation at birth 11/20/2016  . Threatened preterm labor, second trimester 11/15/2016  . Cervical cerclage suture present in second trimester 10/20/2016  . Incompetent cervix 09/08/2016   . ASCUS with positive high risk HPV cervical 09/08/2016    [place] MFM OB LIMITED  Result Date: Nov 28, 2019 ----------------------------------------------------------------------  OBSTETRICS REPORT                         (Signed Final Nov 28, 2019 03:42 pm) ---------------------------------------------------------------------- Patient Info  ID #:        [id number]                          D.O.B.:  [birth year] (28 yrs)  Name:        Tamara Cowan                  Visit Date: 2019/11/28 02:48 pm ---------------------------------------------------------------------- Performed By  Attending:         Lin Landsman      Ref. Address:      [address]                     [address]                                        [address]                                                               [place], [place]  [id number]  Performed By:      Marcellina MillinKelly L Moser RDMS     [facility]    [facility]  Referred By:       Margarette AsalHAILEY L               [facility]                     SPARACINO DO                              Fetal Care ---------------------------------------------------------------------- Orders  #   Description                          Code         Ordered By  1   US MFM OB LIMITED                    76815.01     HAILEY SPARACINO ----------------------------------------------------------------------  #   Order #                    Accession #                 Episode #  1   [id number]                  [id number]                  [id number] ---------------------------------------------------------------------- Indications  Unable to hear fetal heart tones as reason for  O76  ultrasound  [redacted] weeks gestation of pregnancy                 Z3A.17  Cervical cerclage suture present, second        O34.32  trimester  Poor obstetric history: Previous preterm        O09.219  delivery, antepartum (20, 25 weeks)  Abdominal pain in pregnancy                      O99.89 ---------------------------------------------------------------------- Fetal Evaluation  Num Of Fetuses:          1  Cardiac Activity:        Not visualized  Presentation:            Breech  Placenta:                Posterior  Amniotic Fluid  AFI FV:      Within normal limits ---------------------------------------------------------------------- OB History  Gravidity:     4         Term:  0          Prem:  2        SAB:   0  TOP:           1       Ectopic: 0         Living: 1 ---------------------------------------------------------------------- Gestational Age  Best:           17w 0d    Det. ByMarcella Dubs:  Early Ultrasound           EDD:  05/06/20                                      (  09/16/19) ---------------------------------------------------------------------- Cervix Uterus Adnexa  Cervix  Cerclage visualized. ---------------------------------------------------------------------- Impression  Single intrauterine pregnancy  Limited exam for decreased fetal movement  No cardiac activity observed. ---------------------------------------------------------------------- Recommendations  Management per inpatient provider. ----------------------------------------------------------------------               Sander Nephew, MD Electronically Signed Final Report   11/27/2019 03:42 pm ----------------------------------------------------------------------  Results for orders placed or performed during the hospital encounter of 11/27/19 (from the past 24 hour(s))  Urinalysis, Routine w reflex microscopic     Status: Abnormal   Collection Time: 11/27/19  2:18 PM  Result Value Ref Range   Color, Urine YELLOW YELLOW   APPearance CLEAR CLEAR   Specific Gravity, Urine 1.018 1.005 - 1.030   pH 7.0 5.0 - 8.0   Glucose, UA NEGATIVE NEGATIVE mg/dL   Hgb urine dipstick NEGATIVE NEGATIVE   Bilirubin Urine NEGATIVE NEGATIVE   Ketones, ur NEGATIVE NEGATIVE mg/dL   Protein, ur NEGATIVE NEGATIVE mg/dL   Nitrite NEGATIVE  NEGATIVE   Leukocytes,Ua MODERATE (A) NEGATIVE   RBC / HPF 0-5 0 - 5 RBC/hpf   WBC, UA 11-20 0 - 5 WBC/hpf   Bacteria, UA RARE (A) NONE SEEN   Squamous Epithelial / LPF 0-5 0 - 5  Wet prep, genital     Status: Abnormal   Collection Time: 11/27/19  2:55 PM   Specimen: Vaginal  Result Value Ref Range   Yeast Wet Prep HPF POC PRESENT (A) NONE SEEN   Trich, Wet Prep NONE SEEN NONE SEEN   Clue Cells Wet Prep HPF POC NONE SEEN NONE SEEN   WBC, Wet Prep HPF POC MANY (A) NONE SEEN   Sperm NONE SEEN   Type and screen [place]     Status: None   Collection Time: 11/27/19  3:30 PM  Result Value Ref Range   ABO/RH(D) O POS    Antibody Screen NEG    Sample Expiration      11/30/2019,2359 Performed at [facility], [address] [place], [place] [address]   CBC on admission     Status: Abnormal   Collection Time: 11/27/19  3:35 PM  Result Value Ref Range   WBC 12.3 (H) 4.0 - 10.5 K/uL   RBC 3.99 3.87 - 5.11 MIL/uL   Hemoglobin 10.9 (L) 12.0 - 15.0 g/dL   HCT 34.0 (L) 36.0 - 46.0 %   MCV 85.2 80.0 - 100.0 fL   MCH 27.3 26.0 - 34.0 pg   MCHC 32.1 30.0 - 36.0 g/dL   RDW 13.8 11.5 - 15.5 %   Platelets 197 150 - 400 K/uL   nRBC 0.0 0.0 - 0.2 %     Assessment: Kc Sedlak is a 29 y.o. O2V0350 at [redacted]w[redacted]d here for IUFD, found to have a fever of 101*F, s/p cerclage   Plan: Admit to [facility] for IOL 2/2 17 week IUFD -Cerclage removed at bedside by Dr. Rosana Hoes -Doxycycline and Flagyl for presumed infection -Blood cultures, TORCH titers, Wet Prep, and GC/CT swab ordered -COVID negative last week -Wet Prep shows yeast, will treat with fluconazole -Cytotec induction 600 q6h -Patient does wish to have genetic testing done after delivery  Hailey L Sparacino, DO  11/27/2019, 3:59 PM

## 2019-11-27 NOTE — MAU Note (Signed)
.   Tamara Cowan is a 30 y.o. at [redacted]w[redacted]d here in MAU reporting: complaints of abdominal cramping since yesterday. PT states she had a cerclage placed on 11/21/19. Hx of preterm delivery at 25 weeks. Reports large amount of mucous discharge LMP:  Onset of complaint: ongoing Pain score: 8 Vitals:   11/27/19 1413  BP: 114/67  Resp: 16  Temp: (!) 101 F (38.3 C)     FHT: Lab orders placed from triage: UA

## 2019-11-28 DIAGNOSIS — O864 Pyrexia of unknown origin following delivery: Secondary | ICD-10-CM

## 2019-11-28 LAB — CBC
HCT: 33.4 % — ABNORMAL LOW (ref 36.0–46.0)
Hemoglobin: 10.9 g/dL — ABNORMAL LOW (ref 12.0–15.0)
MCH: 28.2 pg (ref 26.0–34.0)
MCHC: 32.6 g/dL (ref 30.0–36.0)
MCV: 86.3 fL (ref 80.0–100.0)
Platelets: 196 10*3/uL (ref 150–400)
RBC: 3.87 MIL/uL (ref 3.87–5.11)
RDW: 14 % (ref 11.5–15.5)
WBC: 14.5 10*3/uL — ABNORMAL HIGH (ref 4.0–10.5)
nRBC: 0 % (ref 0.0–0.2)

## 2019-11-28 LAB — GC/CHLAMYDIA PROBE AMP (~~LOC~~) NOT AT ARMC
Chlamydia: NEGATIVE
Comment: NEGATIVE
Comment: NORMAL
Neisseria Gonorrhea: NEGATIVE

## 2019-11-28 LAB — URINE CULTURE: Culture: NO GROWTH

## 2019-11-28 NOTE — Progress Notes (Signed)
Discharge teaching complete. Pt discharged home with mother.

## 2019-11-28 NOTE — Progress Notes (Signed)
CSW contacted by RN and informed that MOB experienced a fetal demise and FOB "blew up at her" and was escorted out by security. RN requested that CSW meet with MOB for support.   CSW met with MOB at bedside, MOB's mother present. CSW introduced self and explained reason for visit. MOB reported that she was fine and denied any needs/concerns. CSW offered MOB counseling resources and family justice center information, MOB declined. CSW offered condolences for MOB's loss and inquired about infant's name, MOB reported that she did not name infant. CSW acknowledged and validated MOB's decision. CSW asked if MOB was agreeable to having CSW's contact information to call if any needs/concerns arise, MOB reported yes. CSW provided MOB with CSW contact information and encouraged MOB to contact CSW if any needs/concerns arise. MOB thanked CSW.   Abundio Miu, [place] Worker [facility] Cell#: [phone]

## 2019-11-28 NOTE — Discharge Summary (Signed)
Postpartum Discharge Summary     Patient Name: Tamara Cowan DOB: 11-19-1990 MRN: [id number]  Date of admission: 11/27/2019 Delivery date: 11/27/19 Delivering provider: Loma Boston, MD Date of discharge: 11/28/2019  Admitting diagnosis: Fetal demise before 21 weeks with retention of dead fetus [O02.1] Intrauterine pregnancy: [redacted]w[redacted]d    Secondary diagnosis:  Principal Problem:   Fetal demise before 20 weeks with retention of dead fetus Active Problems:   Cervical cerclage suture present in second trimester   Status post C-section   History of preterm delivery, currently pregnant   History of incompetent cervix, currently pregnant in second trimester     Discharge diagnosis: intrauterine fetal demise with fever of unknown origin                                              Augmentation: Cytotec Complications: None  Hospital course: Patient arrived to MAU and diagnosed with intrauterine fetal demise. Noted to be febrile in MAU. She was admitted for induction of labor, started on antibiotics and counseled regarding induction. She was agreeable to plan. Cerclage cut and induction started with cytotec. She delivered a short time thereafter, placenta intact. She was given an additional dose of antibiotics. BY PPD#1, she was eating, ambulating and feeling well. Denies pain and reports minimal bleeding. She remained afebrile. I offered behavioral health services which she declined. She also declined a consult to social work or the cClinical biochemist She is discharged home in stable condition, will have follow up scheduled in the office.  Magnesium Sulfate received: No BMZ received: No Rhophylac:No MMR:No T-DaP:n/a Flu: No Transfusion:No  Physical exam  Vitals:   11/28/19 0029 11/28/19 0030 11/28/19 0602 11/28/19 0838  BP: (!) 106/55  103/67 118/82  Pulse: 88  74 80  Resp: _0 Temp: 97.9 F (36.6 C)  97.6 F (36.4 C) 97.9 F (36.6 C)  TempSrc: Oral  Oral Oral  SpO2: 100% 99% 100%  100%  Weight:      Height:       General: alert, cooperative and no distress Lochia: appropriate Uterine Fundus: firm Incision: N/A DVT Evaluation: No evidence of DVT seen on physical exam. Labs: Lab Results  Component Value Date   WBC 14.5 (H) 11/28/2019   HGB 10.9 (L) 11/28/2019   HCT 33.4 (L) 11/28/2019   MCV 86.3 11/28/2019   PLT 196 11/28/2019   CMP Latest Ref Rng & Units 09/06/2019  Glucose 70 - 99 mg/dL 88  BUN 6 - 20 mg/dL 11  Creatinine 0.44 - 1.00 mg/dL 0.74  Sodium 135 - 145 mmol/L 135  Potassium 3.5 - 5.1 mmol/L 3.5  Chloride 98 - 111 mmol/L 102  CO2 22 - 32 mmol/L 26  Calcium 8.9 - 10.3 mg/dL 9.4  Total Protein 6.5 - 8.1 g/dL 8.0  Total Bilirubin 0.3 - 1.2 mg/dL 0.2(L)  Alkaline Phos 38 - 126 U/L 56  AST 15 - 41 U/L 21  ALT 0 - 44 U/L 25   Edinburgh Score: No flowsheet data found.   After visit meds:  Allergies as of 11/28/2019   No Known Allergies     Medication List    STOP taking these medications   acetaminophen 500 MG tablet Commonly known as: TYLENOL   metoCLOPramide 10 MG tablet Commonly known as: REGLAN   metroNIDAZOLE 500 MG tablet Commonly known as:  FLAGYL   oxyCODONE-acetaminophen 5-325 MG tablet Commonly known as: PERCOCET/ROXICET     TAKE these medications   PRENATAL VITAMINS PO Take 1 tablet by mouth daily.        Discharge home in stable condition Infant Disposition: cremation Discharge instruction: per After Visit Summary and Postpartum booklet. Activity: Advance as tolerated. Pelvic rest for 6 weeks.  Diet: routine diet Future Appointments: Future Appointments  Date Time Provider [place]  12/11/2019  8:30 AM [facility] NURSE [facility]  12/11/2019  8:30 AM [facility] US3 [facility]  12/18/2019 10:55 AM Chancy Milroy, MD [facility]   Follow up Visit: [place] for [facility]. Go to.   Specialty: Obstetrics and Gynecology Contact  information: [place] [id number] [phone]          11/28/2019 Sloan Leiter, MD

## 2019-11-28 NOTE — Progress Notes (Signed)
Received phone call from [facility] concerning the disposition of the fetus.  She requests the remains go to pathology for disposal.

## 2019-11-28 NOTE — Discharge Instructions (Signed)
Vaginal Delivery, Care After This sheet gives you information about how to care for yourself after your delivery. Your health care provider may also give you more specific instructions. If you have problems or questions, contact your health care provider. What can I expect after the procedure? After delivery, it is common to have:  Soreness in your abdomen, your vagina, and the area between the opening of your vagina and your anus (perineum).  Tiredness (fatigue).  Cramps.  Breast tenderness related to engorgement.  Some bleeding and discharge from your vagina. This may continue for about 6 weeks. The bleeding and discharge will start out red, then become pink, then yellow, and finally white.  Tenderness in your vagina or perineum if you had an episiotomy or a vaginal tear. This may last several weeks.  Emotions that change quickly. Common emotions include: ? Sadness. ? Anger. ? Denial. ? Guilt. ? Depression. Follow these instructions at home: Vaginal and perineal care  Keep your perineum clean and dry as told by your health care provider.  Wipe from front to back when you use the toilet.  If you have an episiotomy or a vaginal tear, check for signs of infection, such as: ? Increasing redness, swelling, or pain in your perineal area. ? Pus or bad-smelling discharge coming from your wound or vagina.  To relieve pain at the wound area or pain caused by hemorrhoids, try taking a warm sitz bath 2-3 times a day.  Do not use tampons or douche until your health care provider says it is okay. Medicines  Take over-the-counter and prescription medicines only as told by your health care provider.  If you were prescribed an antibiotic medicine, take it as told by your health care provider. Do not stop taking the antibiotic even if you start to feel better. Eating and drinking   Drink enough fluids to keep your urine pale yellow.  Eat high-fiber foods every day. These foods may help  prevent or relieve constipation. High-fiber foods include: ? Whole grain cereals and breads. ? Brown rice. ? Beans. ? Fresh fruits and vegetables. Activity  If possible, have someone help you with your household activities for at least a few days after you leave the hospital.  Return to your normal activities as told by your health care provider. Ask your health care provider what activities are safe for you.  Rest as much as possible.  Talk with your health care provider about when you can engage in sexual activity. This may depend on your: ? Risk of infection. ? Rate of healing. ? Comfort and desire to engage in sexual activity. Emotional support  Consider seeking support for your loss. Some forms of support that you might consider include your religious leader, friends, family, a professional counselor, or a bereavement support group. General instructions  Wear a supportive and well-fitting bra.  If you pass a blood clot, save it and call your health care provider to discuss. Do not flush blood clots down the toilet before you get instructions from your health care provider.  Keep all of your scheduled postpartum visits. At these visits, your health care provider will check to make sure that you are healing, both physically and emotionally. Contact a health care provider if:  You feel sad or depressed.  You are having trouble eating or sleeping.  You lose interest in activities you used to enjoy.  You pass a blood clot from your vagina.  You have pus or a bad-smelling discharge coming from   your wound or vagina.  You have increasing redness, swelling, or pain in your perineal area.  You feel pain or burning when you urinate.  You urinate more often than normal.  You have a fever.  Your breasts become hard, red, or painful.  You are dizzy or light-headed.  You have a rash.  You feel nauseous or you vomit.  You have not had a menstrual period by the 12th week  after delivery. Get help right away if:  You have persistent pain that is not relieved by comfort measures or medicines.  You have chest pain or trouble breathing.  You have blurred vision or you see spots.  You have a severe headache.  You faint.  You have sudden, severe leg pain.  You bleed from your vagina so much that you fill more than one sanitary pad in one hour. Bleeding should not be heavier than your heaviest period.  You have thoughts of hurting yourself. If you ever feel like you may hurt yourself or others, or have thoughts about taking your own life, get help right away. You can go to your nearest emergency department or call:  Your local emergency services (911 in the [place]  A suicide crisis helpline, such as the National Suicide Prevention Lifeline at 1-800-273-8255. This is open 24 hours a day. Summary  Take over-the-counter and prescription medicines only as told by your health care provider.  Return to your normal activities as told by your health care provider. Ask your health care provider what activities are safe for you.  Consider getting support for your loss. Sources of support include religious leaders, friends, family, professional counselors, and bereavement support groups.  Keep all follow-up visits as told by your health care provider. This is important. This information is not intended to replace advice given to you by your health care provider. Make sure you discuss any questions you have with your health care provider. Document Revised: 07/06/2017 Document Reviewed: 09/30/2016 Elsevier Patient Education  2020 Elsevier Inc.  

## 2019-11-29 LAB — TORCH-IGM(TOXO/ RUB/ CMV/ HSV) W TITER
CMV IgM: <30 AU/mL (ref 0.0–29.9)
HSVI/II Comb IgM: <0.91 Ratio (ref 0.00–0.90)
Rubella IgM: <20 AU/mL (ref 0.0–19.9)
Toxoplasma Antibody- IgM: <3 AU/mL (ref 0.0–7.9)

## 2019-11-29 LAB — INFECT DISEASE AB IGM REFLEX 1

## 2019-12-02 LAB — CULTURE, BLOOD (ROUTINE X 2)
Culture: NO GROWTH
Culture: NO GROWTH
Special Requests: ADEQUATE
Special Requests: ADEQUATE

## 2019-12-04 LAB — SURGICAL PATHOLOGY

## 2019-12-05 ENCOUNTER — Encounter: Payer: Self-pay | Admitting: *Deleted

## 2019-12-05 DIAGNOSIS — O0990 Supervision of high risk pregnancy, unspecified, unspecified trimester: Secondary | ICD-10-CM

## 2019-12-11 ENCOUNTER — Other Ambulatory Visit: Payer: Medicaid Other

## 2019-12-11 ENCOUNTER — Ambulatory Visit: Payer: Medicaid Other

## 2019-12-11 ENCOUNTER — Telehealth: Payer: Self-pay

## 2019-12-11 NOTE — Telephone Encounter (Signed)
Called pt to advise of Horizon results & getting genetic counseling, pt states Avelina Laine has already reached out to her.

## 2019-12-12 ENCOUNTER — Encounter: Payer: Self-pay | Admitting: Family Medicine

## 2019-12-12 DIAGNOSIS — D563 Thalassemia minor: Secondary | ICD-10-CM | POA: Insufficient documentation

## 2019-12-18 ENCOUNTER — Ambulatory Visit: Payer: Medicaid Other | Admitting: Obstetrics and Gynecology

## 2019-12-19 ENCOUNTER — Ambulatory Visit: Payer: Medicaid Other | Admitting: Obstetrics and Gynecology

## 2020-01-03 ENCOUNTER — Encounter: Payer: Self-pay | Admitting: General Practice

## 2020-03-18 ENCOUNTER — Encounter: Payer: Self-pay | Admitting: *Deleted

## 2020-08-01 ENCOUNTER — Other Ambulatory Visit: Payer: Medicaid Other

## 2020-09-21 ENCOUNTER — Other Ambulatory Visit: Payer: Self-pay

## 2020-09-21 ENCOUNTER — Emergency Department (HOSPITAL_COMMUNITY)
Admission: EM | Admit: 2020-09-21 | Discharge: 2020-09-21 | Disposition: A | Discharge status: Home / Self Care | Payer: Medicaid Other | Attending: Emergency Medicine | Admitting: Emergency Medicine

## 2020-09-21 DIAGNOSIS — N3000 Acute cystitis without hematuria: Secondary | ICD-10-CM | POA: Insufficient documentation

## 2020-09-21 DIAGNOSIS — Z87891 Personal history of nicotine dependence: Secondary | ICD-10-CM | POA: Diagnosis not present

## 2020-09-21 DIAGNOSIS — R300 Dysuria: Secondary | ICD-10-CM | POA: Diagnosis present

## 2020-09-21 DIAGNOSIS — N3090 Cystitis, unspecified without hematuria: Secondary | ICD-10-CM

## 2020-09-21 LAB — URINALYSIS, ROUTINE W REFLEX MICROSCOPIC
Bilirubin Urine: NEGATIVE
Glucose, UA: NEGATIVE mg/dL
Ketones, ur: NEGATIVE mg/dL
Nitrite: POSITIVE — AB
Protein, ur: 30 mg/dL — AB
Specific Gravity, Urine: 1.018 (ref 1.005–1.030)
pH: 7 (ref 5.0–8.0)

## 2020-09-21 LAB — POC URINE PREG, ED: Preg Test, Ur: NEGATIVE

## 2020-09-21 MED ORDER — FLUCONAZOLE 150 MG PO TABS
150.0000 mg | ORAL_TABLET | Freq: Every day | ORAL | 1 refills | Status: DC
Start: 2020-09-21 — End: 2022-04-13

## 2020-09-21 MED ORDER — PHENAZOPYRIDINE HCL 200 MG PO TABS
200.0000 mg | ORAL_TABLET | Freq: Three times a day (TID) | ORAL | 0 refills | Status: DC
Start: 2020-09-21 — End: 2022-04-13

## 2020-09-21 MED ORDER — CEPHALEXIN 500 MG PO CAPS: 500.0000 mg | ORAL_CAPSULE | Freq: Three times a day (TID) | ORAL | 0 refills | Status: AC

## 2020-09-21 NOTE — ED Triage Notes (Signed)
Patient reports pain with urination. Pain is 8/10.

## 2020-09-21 NOTE — ED Provider Notes (Signed)
[facility]-EMERGENCY DEPT Provider Note   CSN: [id number] Arrival date & time: 09/21/20  1449     History Chief Complaint  Patient presents with  . Dysuria    Tamara Cowan is a 30 y.o. female.  HPI   Patient presents to the ER for evaluation of urinary discomfort.  Patient states her symptoms started a couple days ago.  She was trying to wait to see her doctor during the week but her symptoms increased today.  Patient states this morning was very intense and she had severe pressure with urination.  He has been having burning discomfort.  Not having any fevers or chills.  No vomiting or diarrhea.  No abdominal pain.  Past Medical History:  Diagnosis Date  . Headache   . Preterm labor   . UTI (urinary tract infection) 2010   during pregnancy    Patient Active Problem List   Diagnosis Date Noted  . Alpha thalassemia silent carrier 12/12/2019  . Fever of unknown origin during puerperium 11/28/2019  . Fetal demise before 20 weeks with retention of dead fetus 2019/12/16  . Supervision of high risk pregnancy, antepartum 10/24/2019  . History of preterm delivery, currently pregnant 10/24/2019  . History of incompetent cervix, currently pregnant in second trimester 10/24/2019  . Status post C-section 11/20/2016  . Threatened preterm labor, second trimester 11/15/2016  . Cervical cerclage suture present in second trimester 10/20/2016  . 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 ASCUS with positive high risk HPV cervical 09/08/2016    Past Surgical History:  Procedure Laterality Date  . CERVICAL CERCLAGE N/A 09/11/2016   Procedure: CERCLAGE CERVICAL;  Surgeon: Adam Phenix, MD;  Location: [facility] ORS;  Service: Gynecology;  Laterality: N/A;  . CERVICAL CERCLAGE N/A 11/21/2019   Procedure: CERCLAGE CERVICAL;  Surgeon: Catalina Antigua, MD;  Location: [facility] ORS;  Service: Gynecology;  Laterality: N/A;  . CESAREAN SECTION N/A 11/17/2016   Procedure: CESAREAN SECTION;   Surgeon: [place] Bing, MD;  Location: [facility] BIRTHING SUITES;  Service: Obstetrics;  Laterality: N/A;     OB History    Gravida  4   Para  2   Term      Preterm  2   AB  1   Living  1     SAB  0   IAB  1   Ectopic  0   Multiple  0   Live Births  2           Family History  Problem Relation Age of Onset  . Hypertension Father   . Heart disease Father        has a pacemaker    Social History   Tobacco Use  . Smoking status: Former Smoker    Years: 0.50    Types: Cigarettes    Quit date: 10/24/2018    Years since quitting: 1.9  . Smokeless tobacco: Never Used  . Tobacco comment: 1-2 PER DAY  Vaping Use  . Vaping Use: Never used  Substance Use Topics  . Alcohol use: Not Currently    Alcohol/week: 1.0 standard drink    Types: 1 Glasses of wine per week    Comment: occasionally  . Drug use: No    Home Medications Prior to Admission medications   Medication Sig Start Date End Date Taking? Authorizing Provider  cephALEXin (KEFLEX) 500 MG capsule Take 1 capsule (500 mg total) by mouth 3 (three) times daily for 5 days. 09/21/20 09/26/20 Yes Linwood Dibbles, MD  fluconazole (DIFLUCAN) 150 MG tablet Take 1 tablet (150 mg total) by mouth daily. 09/21/20  Yes Linwood Dibbles, MD  phenazopyridine (PYRIDIUM) 200 MG tablet Take 1 tablet (200 mg total) by mouth 3 (three) times daily. 09/21/20  Yes Linwood Dibbles, MD  Prenatal Vit-Fe Fumarate-FA (PRENATAL VITAMINS PO) Take 1 tablet by mouth daily.    [provider]    Allergies    Patient has no known allergies.  Review of Systems   Review of Systems  All other systems reviewed and are negative.   Physical Exam Updated Vital Signs BP (!) 148/110 (BP Location: Left Arm)   Pulse 90   Temp 99 F (37.2 C) (Oral)   Resp 18   Ht 1.702 m (5\' 7" )   LMP 08/16/2020   SpO2 100%   BMI 28.20 kg/m   Physical Exam Vitals and nursing note reviewed.  Constitutional:      General: She is not in acute distress.     Appearance: She is well-developed.  HENT:     Head: Normocephalic and atraumatic.     Right Ear: External ear normal.     Left Ear: External ear normal.  Eyes:     General: No scleral icterus.       Right eye: No discharge.        Left eye: No discharge.     Conjunctiva/sclera: Conjunctivae normal.  Neck:     Trachea: No tracheal deviation.  Cardiovascular:     Rate and Rhythm: Normal rate and regular rhythm.  Pulmonary:     Effort: Pulmonary effort is normal. No respiratory distress.     Breath sounds: Normal breath sounds. No stridor. No wheezing or rales.  Abdominal:     General: Bowel sounds are normal. There is no distension.     Palpations: Abdomen is soft.     Tenderness: There is no abdominal tenderness. There is no guarding or rebound.  Musculoskeletal:        General: No tenderness.     Cervical back: Neck supple.  Skin:    General: Skin is warm and dry.     Findings: No rash.  Neurological:     Mental Status: She is alert.     Cranial Nerves: No cranial nerve deficit (no facial droop, extraocular movements intact, no slurred speech).     Sensory: No sensory deficit.     Motor: No abnormal muscle tone or seizure activity.     Coordination: Coordination normal.     ED Results / Procedures / Treatments   Labs (all labs ordered are listed, but only abnormal results are displayed) Labs Reviewed  URINALYSIS, ROUTINE W REFLEX MICROSCOPIC - Abnormal; Notable for the following components:      Result Value   APPearance HAZY (*)    Hgb urine dipstick SMALL (*)    Protein, ur 30 (*)    Nitrite POSITIVE (*)    Leukocytes,Ua MODERATE (*)    Bacteria, UA FEW (*)    All other components within normal limits  POC URINE PREG, ED     Procedures Procedures   Medications Ordered in ED Medications - No data to display  ED Course  I have reviewed the triage vital signs and the nursing notes.  Pertinent labs & imaging results that were available during my care of the  patient were reviewed by me and considered in my medical decision making (see chart for details).    MDM Rules/Calculators/A&P  Patient presents to the ED for evaluation of urinary tract infection symptoms.  Patient's not having any fevers or chills.  No abdominal pain no findings to suggest pyelonephritis.  Urinalysis is consistent with urinary tract infection.  Will discharge home on a course of Keflex with prescriptions for Pyridium and Diflucan.  Patient states she gets yeast infections when she takes antibiotics. Final Clinical Impression(s) / ED Diagnoses Final diagnoses:  Cystitis    Rx / DC Orders ED Discharge Orders         Ordered    phenazopyridine (PYRIDIUM) 200 MG tablet  3 times daily        09/21/20 1647    cephALEXin (KEFLEX) 500 MG capsule  3 times daily        09/21/20 1647    fluconazole (DIFLUCAN) 150 MG tablet  Daily        09/21/20 1647           Linwood Dibbles, MD 09/21/20 1649

## 2020-09-21 NOTE — Discharge Instructions (Addendum)
The Pyridium can help with any urinary discomfort.  Take the antibiotics until they are finished.  The Diflucan is to help prevent yeast infection.  Follow-up with your doctor to be rechecked if the symptoms do not resolve after a course of antibiotics.

## 2020-09-23 LAB — URINE CULTURE: Culture: >=100000 — AB

## 2020-09-24 ENCOUNTER — Telehealth: Payer: Self-pay | Admitting: Emergency Medicine

## 2020-09-24 NOTE — Telephone Encounter (Signed)
Post ED Visit - Positive Culture Follow-up  Culture report reviewed by antimicrobial stewardship pharmacist: [facility] []  , Pharm.D. []  Enzo Bi, Pharm.D., BCPS AQ-ID []  , Pharm.D., BCPS []  Celedonio Miyamoto, Pharm.D., BCPS []  Braman, Garvin Fila.D., BCPS, AAHIVP []  , Pharm.D., BCPS, AAHIVP []  Georgina Pillion, PharmD, BCPS []  , PharmD, BCPS []  [place], PharmD, BCPS []  [address], PharmD []  , PharmD, BCPS []  Estella Husk, PharmD  Pharmacy Team []  Lysle Pearl, PharmD []  , PharmD []  Phillips Climes, PharmD []  , Rph []  Agapito Games) , PharmD []  Verlan Friends, PharmD []  , PharmD []  Mervyn Gay, PharmD []  , PharmD []  Vinnie Level, PharmD []  [facility], PharmD []  , PharmD []  Len Childs, PharmD   Positive urine culture Treated with cephalexin, organism sensitive to the same and no further patient follow-up is required at this time.  09/24/2020, 10:49 AM

## 2021-03-05 ENCOUNTER — Ambulatory Visit: Payer: Medicaid Other | Admitting: Family Medicine

## 2021-03-06 IMAGING — US US MFM OB LIMITED
1 series · 13 of 13 positions shown · non-contrast
Comparison: none

[Series 1: us mfm ob limited · 13 acquisitions, 13 frames shown]
[im 1/13]
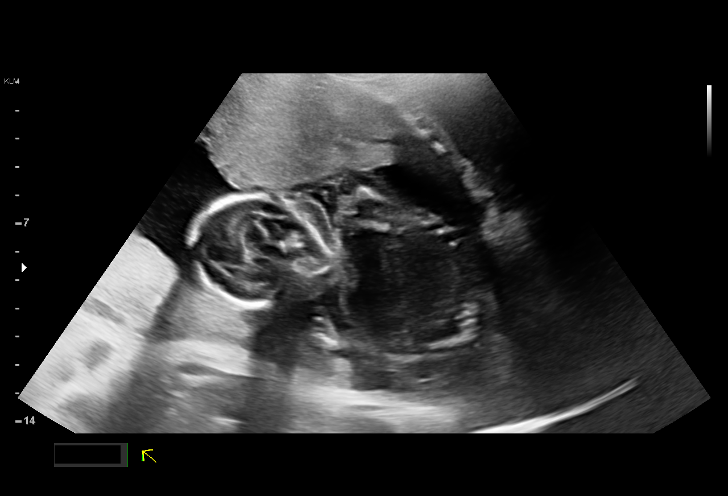
[im 2/13]
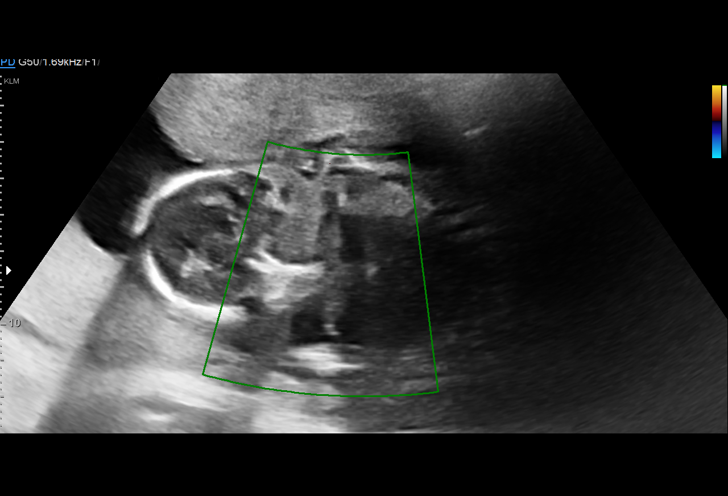
[im 3/13]
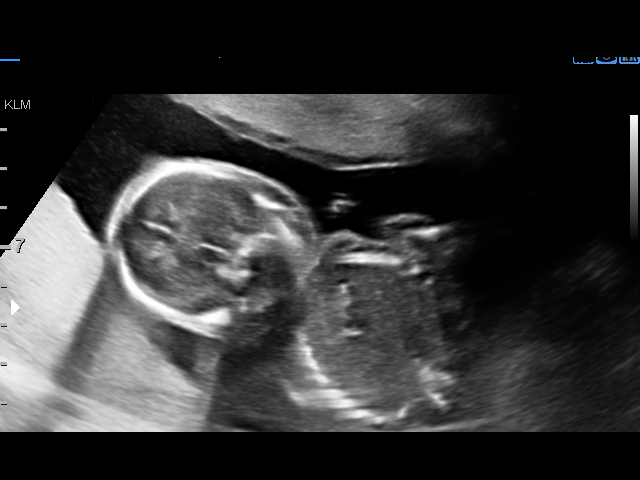
[im 4/13]
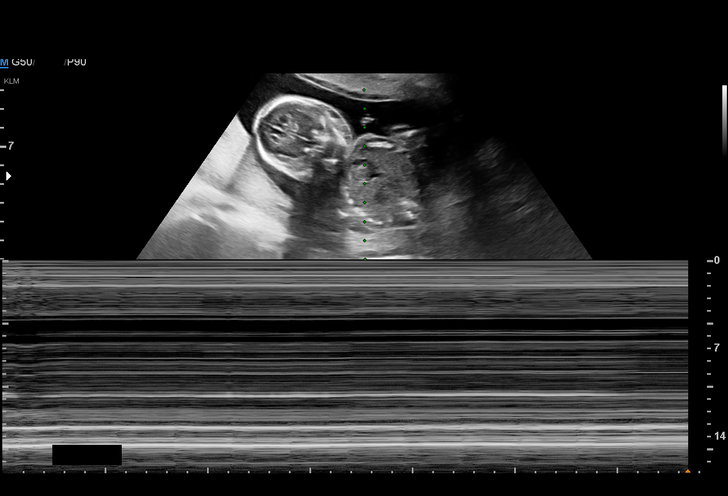
[im 5/13]
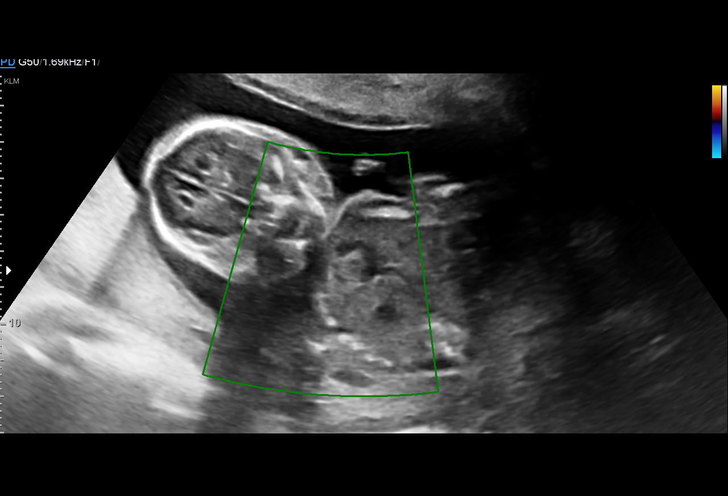
[im 6/13]
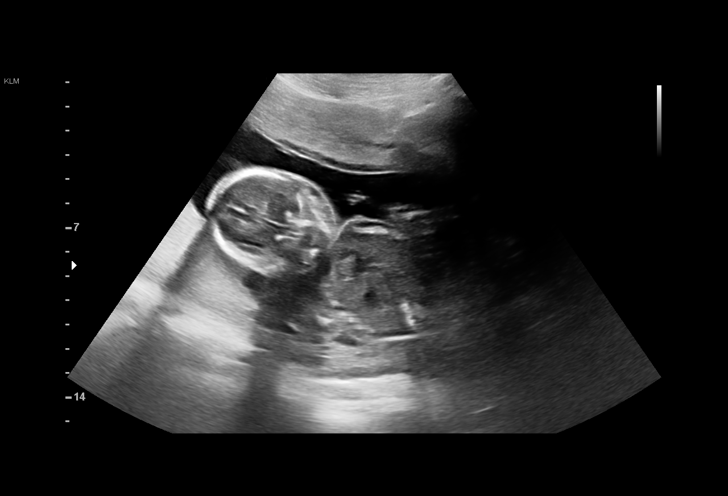
[im 7/13]
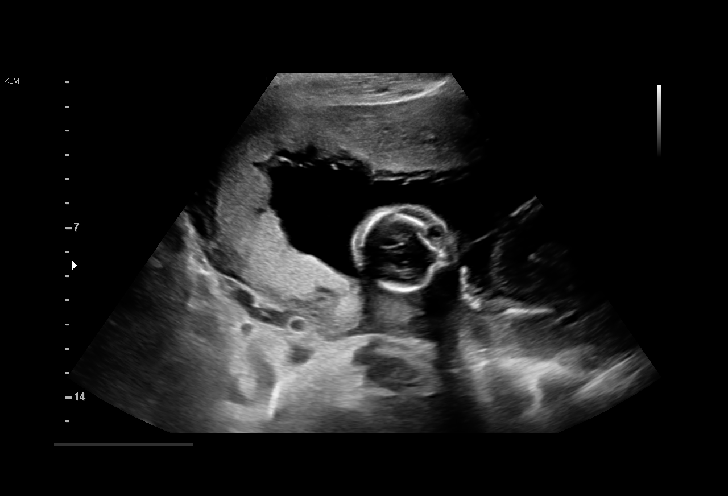
[im 8/13]
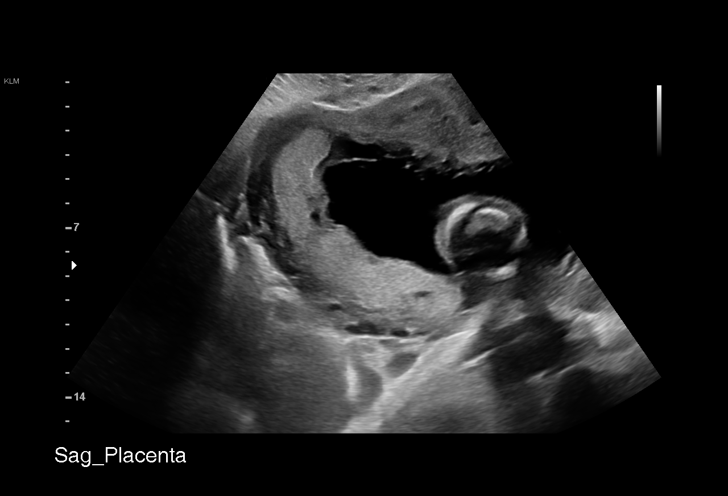
[im 9/13]
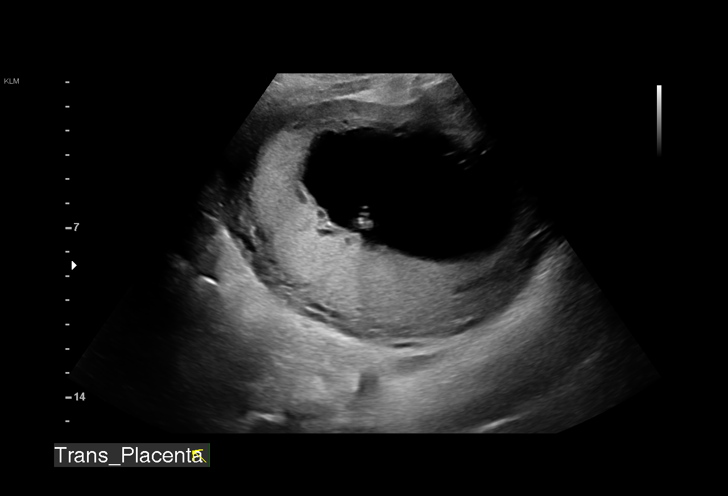
[im 10/13]
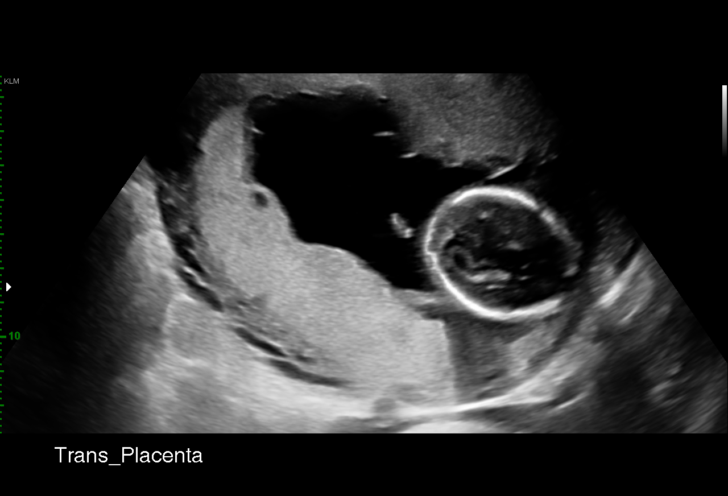
[im 11/13]
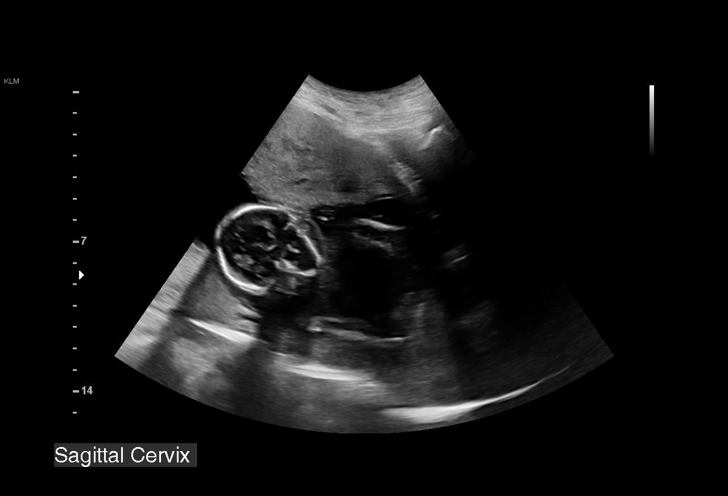
[im 12/13]
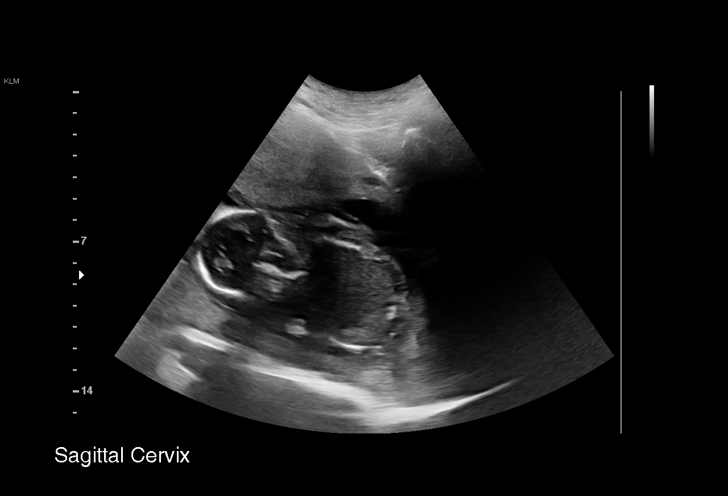
[im 13/13]
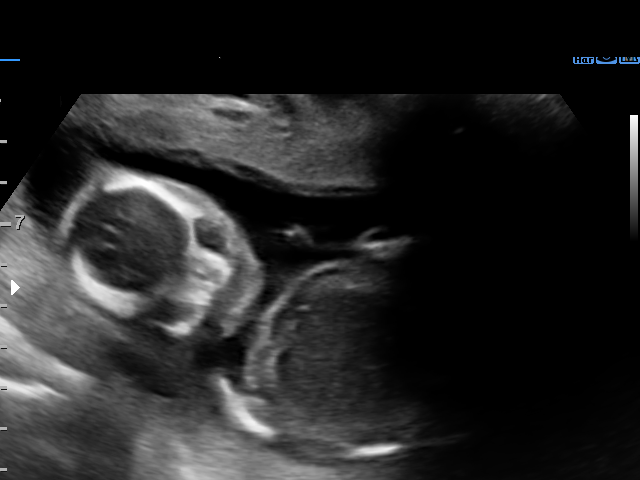

[13 of 13 positions shown; findings below may reference images not displayed]

HARD DO                              [HOSPITAL]

 1   US MFM OB LIMITED                    76815.01     GIORGI JUMPER

Indications

 Unable to hear fetal heart tones as reason for  O76
 ultrasound
 17 weeks gestation of pregnancy
 Cervical cerclage suture present, second
 trimester
 Poor obstetric history: Previous preterm
 delivery, antepartum (20, 25 weeks)
 Abdominal pain in pregnancy
Fetal Evaluation

 Num Of Fetuses:          1
 Cardiac Activity:        Not visualized
 Presentation:            Breech
 Placenta:                Posterior

 Amniotic Fluid
 AFI FV:      Within normal limits
OB History

 Gravidity:     4         Term:  0          Prem:  2        SAB:   0
 TOP:           1       Ectopic: 0         Living: 1
Gestational Age

 Best:           17w 0d    Det. By:  Early Ultrasound           EDD:  05/06/20
                                     (09/16/19)
Cervix Uterus Adnexa

 Cervix
 Cerclage visualized.
Impression

 Single intrauterine pregnancy
 Limited exam for decreased fetal movement
 No cardiac activity observed.
Recommendations

 Management per inpatient provider.

## 2021-09-02 ENCOUNTER — Encounter: Payer: Self-pay | Admitting: Student

## 2021-09-02 ENCOUNTER — Ambulatory Visit (INDEPENDENT_AMBULATORY_CARE_PROVIDER_SITE_OTHER): Payer: Medicaid Other | Admitting: Student

## 2021-09-02 ENCOUNTER — Other Ambulatory Visit: Payer: Self-pay

## 2021-09-02 ENCOUNTER — Other Ambulatory Visit (HOSPITAL_COMMUNITY)
Admission: RE | Admit: 2021-09-02 | Discharge: 2021-09-02 | Disposition: A | Discharge status: Home / Self Care | Payer: Medicaid Other | Source: Ambulatory Visit | Attending: Student | Admitting: Student

## 2021-09-02 VITALS — BP 158/117 | HR 82 | Wt 179.0 lb

## 2021-09-02 DIAGNOSIS — Z23 Encounter for immunization: Secondary | ICD-10-CM | POA: Diagnosis not present

## 2021-09-02 DIAGNOSIS — Z124 Encounter for screening for malignant neoplasm of cervix: Secondary | ICD-10-CM | POA: Insufficient documentation

## 2021-09-02 DIAGNOSIS — Z113 Encounter for screening for infections with a predominantly sexual mode of transmission: Secondary | ICD-10-CM | POA: Diagnosis not present

## 2021-09-02 MED ORDER — FLUCONAZOLE 150 MG PO TABS: 150.0000 mg | ORAL_TABLET | Freq: Every day | ORAL | 1 refills | Status: DC

## 2021-09-02 NOTE — Progress Notes (Signed)
Patient here for annual check up. She expressed concerns about vaginal discharge that started roughly 1 month ago and recently developed vaginal itch that started the beginning of this week. Denies any odor. ? ?I offered patient birth control and she declined. ? ?Martinovic will be getting cervical cancer screening (pap smear) and STD screening today. ? ? ?Gardasil vaccine administered into left deltoid without any complications ? ? ?Elania Crowl, CMA ?  ?

## 2021-09-02 NOTE — Progress Notes (Signed)
?  History:  ?[name] is a 31 y.o. [phone] who presents to clinic today for annual exam and concern for vaginal discomfort. Vaginal discharge started 1 month ago and last week developed vaginal itching. Patient reports that they have not tried anything OTC for relief. Patient concerned about why this happens to her 2-3x/year. Prior occurrences resolved with oral Diflucan, per patient. Patient denies any fevers, pain, chills or n/v. ? ?The following portions of the patient's history were reviewed and updated as appropriate: allergies, current medications, family history, past medical history, social history, past surgical history and problem list. ? ?Review of Systems:  ?Review of Systems  ?All other systems reviewed and are negative. ? ?  ?Objective:  ?Physical Exam ?BP (!) 158/117   Pulse 82   Wt 179 lb (81.2 kg)   LMP 08/29/2021 (Approximate)   BMI 28.04 kg/m?  ?Physical Exam ?Vitals reviewed. Exam conducted with a chaperone present.  ?Constitutional:   ?   Appearance: Normal appearance.  ?Cardiovascular:  ?   Rate and Rhythm: Normal rate and regular rhythm.  ?Pulmonary:  ?   Effort: Pulmonary effort is normal.  ?   Breath sounds: Normal breath sounds.  ?Chest:  ?Breasts: ?   Right: Normal.  ?   Left: Normal.  ?   Comments: Symmetrical, no lumps or nodules palpated ? ?Abdominal:  ?   General: Abdomen is flat.  ?   Palpations: Abdomen is soft.  ?Genitourinary: ?   General: Normal vulva.  ?   Vagina: Vaginal discharge present.  ?   Rectum: Normal.  ?   Comments: External genitalia normal, Pink/reddened vaginal mucosa, normal appearing cervix, Clumpy white discharge noted ? ? ? ?Labs and Imaging ?No results found for this or any previous visit (from the past 24 hour(s)). ? ?No results found. ? ?Health Maintenance Due  ?Topic Date Due  ? COVID-19 Vaccine (1) Never done  ? INFLUENZA VACCINE  02/02/2021  ? PAP SMEAR-Modifier  06/05/2021  ? ? ? ?Assessment & Plan:  ?1. Screening examination for STD  (sexually transmitted disease) ?- HIV Antibody (routine testing w rflx) ?- RPR ?- Hepatitis B Surface AntiGEN ?- Hepatitis C Antibody ?- Cervicovaginal ancillary only( [place]) ? ?2. Encounter for screening for cervical cancer ?- Cytology - PAP( [place]) ?- Reviewed results of prior PAP, explained pathology of HPV ?- Patient agreed to continue HPV Vaccines today ? ?3. Vaginal Discharge ?- Observed on pelvic exam ?- PO Diflucan ordered ?- Patient encouraged to F/U if symptoms do not improve ?- Discussed  feminine hygiene practices ( recommended to discontinue use of scented body washes on vaginal area) ? ? ?Approximately 50% of 30 minute visit spent with this patient on counseling and discussing plan of care ? ?Corlis Hove, NP ?09/02/2021 ?1:01 PM  ?

## 2021-09-03 LAB — CERVICOVAGINAL ANCILLARY ONLY
Bacterial Vaginitis (gardnerella): NEGATIVE
Candida Glabrata: NEGATIVE
Candida Vaginitis: NEGATIVE
Chlamydia: NEGATIVE
Comment: NEGATIVE
Comment: NEGATIVE
Comment: NEGATIVE
Comment: NEGATIVE
Comment: NEGATIVE
Comment: NORMAL
Neisseria Gonorrhea: NEGATIVE
Trichomonas: NEGATIVE

## 2021-09-03 LAB — HEPATITIS B SURFACE ANTIGEN: Hepatitis B Surface Ag: NEGATIVE

## 2021-09-03 LAB — RPR: RPR Ser Ql: NONREACTIVE

## 2021-09-03 LAB — HEPATITIS C ANTIBODY: Hep C Virus Ab: NONREACTIVE

## 2021-09-03 LAB — HIV ANTIBODY (ROUTINE TESTING W REFLEX): HIV Screen 4th Generation wRfx: NONREACTIVE

## 2021-09-04 LAB — CYTOLOGY - PAP
Comment: NEGATIVE
Diagnosis: NEGATIVE
High risk HPV: NEGATIVE

## 2022-04-01 ENCOUNTER — Other Ambulatory Visit: Payer: Self-pay

## 2022-04-01 ENCOUNTER — Emergency Department (HOSPITAL_COMMUNITY): Payer: Medicaid Other

## 2022-04-01 ENCOUNTER — Encounter (HOSPITAL_COMMUNITY): Payer: Self-pay

## 2022-04-01 ENCOUNTER — Emergency Department (HOSPITAL_COMMUNITY)
Admission: EM | Admit: 2022-04-01 | Discharge: 2022-04-01 | Disposition: A | Discharge status: Home / Self Care | Payer: Medicaid Other | Attending: Emergency Medicine | Admitting: Emergency Medicine

## 2022-04-01 DIAGNOSIS — N9489 Other specified conditions associated with female genital organs and menstrual cycle: Secondary | ICD-10-CM | POA: Insufficient documentation

## 2022-04-01 DIAGNOSIS — I10 Essential (primary) hypertension: Secondary | ICD-10-CM | POA: Insufficient documentation

## 2022-04-01 DIAGNOSIS — R0689 Other abnormalities of breathing: Secondary | ICD-10-CM | POA: Insufficient documentation

## 2022-04-01 DIAGNOSIS — R519 Headache, unspecified: Secondary | ICD-10-CM | POA: Diagnosis present

## 2022-04-01 LAB — BASIC METABOLIC PANEL
Anion gap: 4 — ABNORMAL LOW (ref 5–15)
BUN: 10 mg/dL (ref 6–20)
CO2: 27 mmol/L (ref 22–32)
Calcium: 8.8 mg/dL — ABNORMAL LOW (ref 8.9–10.3)
Chloride: 108 mmol/L (ref 98–111)
Creatinine, Ser: 0.98 mg/dL (ref 0.44–1.00)
GFR, Estimated: >60 mL/min (ref >60)
Glucose, Bld: 90 mg/dL (ref 70–99)
Potassium: 3.1 mmol/L — ABNORMAL LOW (ref 3.5–5.1)
Sodium: 139 mmol/L (ref 135–145)

## 2022-04-01 LAB — TROPONIN I (HIGH SENSITIVITY): Troponin I (High Sensitivity): 2 ng/L (ref <18)

## 2022-04-01 LAB — I-STAT BETA HCG BLOOD, ED (MC, WL, AP ONLY): I-stat hCG, quantitative: <5 m[IU]/mL (ref <5)

## 2022-04-01 LAB — CBC
HCT: 37.7 % (ref 36.0–46.0)
Hemoglobin: 12.2 g/dL (ref 12.0–15.0)
MCH: 27.2 pg (ref 26.0–34.0)
MCHC: 32.4 g/dL (ref 30.0–36.0)
MCV: 84 fL (ref 80.0–100.0)
Platelets: 238 10*3/uL (ref 150–400)
RBC: 4.49 MIL/uL (ref 3.87–5.11)
RDW: 13.1 % (ref 11.5–15.5)
WBC: 5 10*3/uL (ref 4.0–10.5)
nRBC: 0 % (ref 0.0–0.2)

## 2022-04-01 LAB — BRAIN NATRIURETIC PEPTIDE: B Natriuretic Peptide: 22.5 pg/mL (ref 0.0–100.0)

## 2022-04-01 MED ORDER — AMLODIPINE BESYLATE 5 MG PO TABS
5.0000 mg | ORAL_TABLET | Freq: Once | ORAL | Status: AC
  Administered 2022-04-01: 5 mg via ORAL
  Filled 2022-04-01: qty 1

## 2022-04-01 NOTE — ED Triage Notes (Signed)
Patient states she has been having dizziness, blurred vision, and elevated blood pressure (174/124 at home). Patient went to another last week and everything looked good. However patient is still having the same symptoms. Patient was given an RX for amlodipine.

## 2022-04-01 NOTE — ED Notes (Signed)
Patient being seen by PA

## 2022-04-01 NOTE — ED Provider Triage Note (Signed)
Emergency Medicine Provider Triage Evaluation Note  Tamara Cowan , a 31 y.o. female  was evaluated in triage.  Pt complains of headache, dizziness, blurred vision, chest pain, and elevated blood pressure.  Patient states that she was seen in the ER near her work last week, says they did an evaluation and discharged her after IV medication, with a prescription for amlodipine.  She states that she lost the prescription a few days later.  She has been continue to check her blood pressure at home, highest reading 174/124.  She has been taking the max dose of Excedrin that relief of her headache.  The symptoms that she is having she says significantly improved after leaving the ER.  But after not having the amlodipine, the symptoms slowly came back and worsened until today.  Review of Systems  Positive: As above Negative: Syncope, shortness of breath, leg swelling  Physical Exam  BP (!) 182/121 (BP Location: Left Arm)   Pulse 96   Temp 98.7 F (37.1 C) (Oral)   Resp 18   Ht 5\' 7"  (1.702 m)   Wt 81.6 kg   LMP 03/01/2022   SpO2 100%   Breastfeeding No   BMI 28.19 kg/m  Gen:   Awake, no distress   Resp:  Normal effort  MSK:   Moves extremities without difficulty  Other:    Medical Decision Making  Medically screening exam initiated at 8:23 PM.  Appropriate orders placed.  Tamara Cowan was informed that the remainder of the evaluation will be completed by another provider, this initial triage assessment does not replace that evaluation, and the importance of remaining in the ED until their evaluation is complete.  Workup initiated for symptomatic HTN   Tamara Cowan T, PA-C 04/01/22 2025

## 2022-04-01 NOTE — ED Provider Notes (Signed)
[facility] DEPT Provider Note   CSN: [id number] Arrival date & time: 04/01/22  2003     History  Chief Complaint  Patient presents with   Headache   Dizziness    Tamara Cowan is a 31 y.o. female here presenting with headache and blurry vision and chest pain and elevated blood pressure.  Patient was seen about a week ago at [facility].  Patient was noted to be hypertensive at that time was prescribed Norvasc.  She also had some headache and was given migraine cocktail.  She states that unfortunately she lost her prescription.  She only took 1 dose of Norvasc.  She was able to call the hospital and get a refill and took 1 dose today.  She states that her blood pressure has been elevated around 1 70-1 80s today.  She also has some intermittent chest pressure and headache for the last several days.   The history is provided by the patient.       Home Medications Prior to Admission medications   Medication Sig Start Date End Date Taking? Authorizing Provider  fluconazole (DIFLUCAN) 150 MG tablet Take 1 tablet (150 mg total) by mouth daily. Patient not taking: Reported on 09/02/2021 09/21/20   Tamara Rank, MD  fluconazole (DIFLUCAN) 150 MG tablet Take 1 tablet (150 mg total) by mouth daily. 09/02/21   Tamara Cowan, CNM  phenazopyridine (PYRIDIUM) 200 MG tablet Take 1 tablet (200 mg total) by mouth 3 (three) times daily. 09/21/20   Tamara Rank, MD  Prenatal Vit-Fe Fumarate-FA (PRENATAL VITAMINS PO) Take 1 tablet by mouth daily. Patient not taking: Reported on 09/02/2021    [provider]      Allergies    Patient has no known allergies.    Review of Systems   Review of Systems  Neurological:  Positive for dizziness and headaches.  All other systems reviewed and are negative.   Physical Exam Updated Vital Signs BP (!) 169/118   Pulse 81   Temp 98.7 F (37.1 C) (Oral)   Resp 18   Ht 5\' 7"  (1.702 m)   Wt 81.6 kg   LMP 03/01/2022    SpO2 100%   Breastfeeding No   BMI 28.19 kg/m  Physical Exam Vitals and nursing note reviewed.  Constitutional:      Appearance: She is well-developed.     Comments: Slightly anxious  HENT:     Head: Normocephalic.     Mouth/Throat:     Mouth: Mucous membranes are moist.     Pharynx: Oropharynx is clear.  Eyes:     Extraocular Movements: Extraocular movements intact.     Pupils: Pupils are equal, round, and reactive to light.  Cardiovascular:     Rate and Rhythm: Normal rate and regular rhythm.     Heart sounds: Normal heart sounds.  Pulmonary:     Effort: Pulmonary effort is normal.     Breath sounds: Normal breath sounds.  Abdominal:     General: Bowel sounds are normal.     Palpations: Abdomen is soft.  Musculoskeletal:        General: Normal range of motion.     Cervical back: Normal range of motion and neck supple.  Skin:    General: Skin is warm.  Neurological:     Mental Status: She is alert and oriented to person, place, and time.  Psychiatric:        Mood and Affect: Mood normal.  Behavior: Behavior normal.     ED Results / Procedures / Treatments   Labs (all labs ordered are listed, but only abnormal results are displayed) Labs Reviewed  BASIC METABOLIC PANEL - Abnormal; Notable for the following components:      Result Value   Potassium 3.1 (*)    Calcium 8.8 (*)    Anion gap 4 (*)    All other components within normal limits  CBC  BRAIN NATRIURETIC PEPTIDE  I-STAT BETA HCG BLOOD, ED ([facility], [facility], [facility] ONLY)  TROPONIN I (HIGH SENSITIVITY)  TROPONIN I (HIGH SENSITIVITY)    EKG EKG Interpretation  Date/Time:  Thursday April 01 2022 20:15:42 EDT Ventricular Rate:  90 PR Interval:  177 QRS Duration: 96 QT Interval:  364 QTC Calculation: 446 R Axis:   -21 Text Interpretation: Sinus rhythm Probable left atrial enlargement Borderline left axis deviation Borderline repol abnrm, anterolateral leads No significant change since last tracing  Confirmed by Tamara Cowan [phone]) on 04/01/2022 9:31:31 PM  Radiology No results found.  Procedures Procedures    Medications Ordered in ED Medications  amLODipine (NORVASC) tablet 5 mg (5 mg Oral Given 04/01/22 2132)    ED Course/ Medical Decision Making/ A&P                           Medical Decision Making Tamara Cowan is a 31 y.o. female here presenting with headache and dizziness and blurry vision and chest pain and elevated blood pressure.  Symptoms have been going on for the last several days.  She did not take her Norvasc until today.  I think likely symptomatic hypertension.  Patient has no focal neuro deficits.  Patient's chest pains been going on for several days and I have low suspicion for ACS or dissection.  Plan to get CBC and BMP and troponin x1 and will give her extra dose of Norvasc  11:03 PM I reviewed patient's labs and independently interpreted chest x-ray.  Troponin is negative.  BNP is were normal.  Patient is not pregnant.  Patient's blood pressure went down from 180s to 160s after given Norvasc.  She states that she has Norvasc at home now.  I told her to take it as prescribed and follow-up with PCP in a week   Problems Addressed: Essential hypertension: acute illness or injury  Amount and/or Complexity of Data Reviewed Labs: ordered. Decision-making details documented in ED Course. Radiology: ordered and independent interpretation performed. Decision-making details documented in ED Course. ECG/medicine tests: ordered and independent interpretation performed. Decision-making details documented in ED Course.  Risk Prescription drug management.    Final Clinical Impression(s) / ED Diagnoses Final diagnoses:  None    Rx / DC Orders ED Discharge Orders     None         Tamara Pander, MD 04/01/22 2304

## 2022-04-01 NOTE — Discharge Instructions (Signed)
You have uncontrolled hypertension.  Please take Norvasc as prescribed by your doctor.  You need to recheck your blood pressure in a week with your doctor  Return to ER if you have worse headache, chest pain, trouble breathing

## 2022-04-02 ENCOUNTER — Emergency Department (HOSPITAL_COMMUNITY)
Admission: EM | Admit: 2022-04-02 | Discharge: 2022-04-03 | Discharge status: Against Medical Advice | Payer: Medicaid Other | Attending: Emergency Medicine | Admitting: Emergency Medicine

## 2022-04-02 ENCOUNTER — Emergency Department (HOSPITAL_COMMUNITY): Payer: Medicaid Other

## 2022-04-02 ENCOUNTER — Other Ambulatory Visit: Payer: Self-pay

## 2022-04-02 DIAGNOSIS — Z5321 Procedure and treatment not carried out due to patient leaving prior to being seen by health care provider: Secondary | ICD-10-CM | POA: Insufficient documentation

## 2022-04-02 DIAGNOSIS — R079 Chest pain, unspecified: Secondary | ICD-10-CM | POA: Insufficient documentation

## 2022-04-02 DIAGNOSIS — R030 Elevated blood-pressure reading, without diagnosis of hypertension: Secondary | ICD-10-CM | POA: Diagnosis present

## 2022-04-02 LAB — COMPREHENSIVE METABOLIC PANEL
ALT: 15 U/L (ref 0–44)
AST: 20 U/L (ref 15–41)
Albumin: 4.3 g/dL (ref 3.5–5.0)
Alkaline Phosphatase: 63 U/L (ref 38–126)
Anion gap: 7 (ref 5–15)
BUN: 12 mg/dL (ref 6–20)
CO2: 25 mmol/L (ref 22–32)
Calcium: 8.9 mg/dL (ref 8.9–10.3)
Chloride: 107 mmol/L (ref 98–111)
Creatinine, Ser: 0.93 mg/dL (ref 0.44–1.00)
GFR, Estimated: >60 mL/min (ref >60)
Glucose, Bld: 88 mg/dL (ref 70–99)
Potassium: 3.1 mmol/L — ABNORMAL LOW (ref 3.5–5.1)
Sodium: 139 mmol/L (ref 135–145)
Total Bilirubin: 0.7 mg/dL (ref 0.3–1.2)
Total Protein: 7.7 g/dL (ref 6.5–8.1)

## 2022-04-02 LAB — CBC WITH DIFFERENTIAL/PLATELET
Abs Immature Granulocytes: 0.02 10*3/uL (ref 0.00–0.07)
Basophils Absolute: 0 10*3/uL (ref 0.0–0.1)
Basophils Relative: 0 %
Eosinophils Absolute: 0.1 10*3/uL (ref 0.0–0.5)
Eosinophils Relative: 2 %
HCT: 41.3 % (ref 36.0–46.0)
Hemoglobin: 13.3 g/dL (ref 12.0–15.0)
Immature Granulocytes: 0 %
Lymphocytes Relative: 37 %
Lymphs Abs: 1.8 10*3/uL (ref 0.7–4.0)
MCH: 26.8 pg (ref 26.0–34.0)
MCHC: 32.2 g/dL (ref 30.0–36.0)
MCV: 83.1 fL (ref 80.0–100.0)
Monocytes Absolute: 0.7 10*3/uL (ref 0.1–1.0)
Monocytes Relative: 14 %
Neutro Abs: 2.2 10*3/uL (ref 1.7–7.7)
Neutrophils Relative %: 47 %
Platelets: 256 10*3/uL (ref 150–400)
RBC: 4.97 MIL/uL (ref 3.87–5.11)
RDW: 13 % (ref 11.5–15.5)
WBC: 4.8 10*3/uL (ref 4.0–10.5)
nRBC: 0 % (ref 0.0–0.2)

## 2022-04-02 LAB — I-STAT BETA HCG BLOOD, ED (MC, WL, AP ONLY): I-stat hCG, quantitative: <5 m[IU]/mL (ref <5)

## 2022-04-02 LAB — TROPONIN I (HIGH SENSITIVITY): Troponin I (High Sensitivity): 2 ng/L (ref <18)

## 2022-04-02 NOTE — ED Triage Notes (Signed)
Pt states seen for same last night.  Taking BP meds. Does not feel she has gotten any better.

## 2022-04-02 NOTE — ED Provider Triage Note (Signed)
Emergency Medicine Provider Triage Evaluation Note  Tamara Cowan , a 31 y.o. female  was evaluated in triage.  Pt complains of high blood pressure.  Patient states that she was seen yesterday for high blood pressure.  She states that I just started amlodipine yesterday and has been taking this but does not feel like her blood pressures gotten any better.  She reports that she was having chest pains prior to being in the hospital yesterday, and these have been unchanged.  She is very concerned that her blood pressure is not getting any better after being on amlodipine for 1 day.  No new symptoms today.   Review of Systems  Positive:  Negative:   Physical Exam  BP (!) 159/119 (BP Location: Left Arm)   Pulse 86   Temp 98.3 F (36.8 C) (Oral)   Resp 16   LMP 03/01/2022   SpO2 100%  Gen:   Awake, no distress   Resp:  Normal effort  MSK:   Moves extremities without difficulty  Other:    Medical Decision Making  Medically screening exam initiated at 4:05 PM.  Appropriate orders placed.  Arbor Leer was informed that the remainder of the evaluation will be completed by another provider, this initial triage assessment does not replace that evaluation, and the importance of remaining in the ED until their evaluation is complete.     Adolphus Birchwood, PA-C 04/02/22 1606

## 2022-04-06 DIAGNOSIS — Z1231 Encounter for screening mammogram for malignant neoplasm of breast: Secondary | ICD-10-CM

## 2022-04-09 ENCOUNTER — Emergency Department (HOSPITAL_COMMUNITY)
Admission: EM | Admit: 2022-04-09 | Discharge: 2022-04-09 | Disposition: A | Discharge status: Home / Self Care | Payer: Medicaid Other | Attending: Emergency Medicine | Admitting: Emergency Medicine

## 2022-04-09 ENCOUNTER — Encounter (HOSPITAL_COMMUNITY): Payer: Self-pay

## 2022-04-09 ENCOUNTER — Other Ambulatory Visit: Payer: Self-pay

## 2022-04-09 DIAGNOSIS — R079 Chest pain, unspecified: Secondary | ICD-10-CM | POA: Diagnosis present

## 2022-04-09 DIAGNOSIS — R109 Unspecified abdominal pain: Secondary | ICD-10-CM | POA: Insufficient documentation

## 2022-04-09 DIAGNOSIS — I10 Essential (primary) hypertension: Secondary | ICD-10-CM | POA: Diagnosis not present

## 2022-04-09 LAB — CBC WITH DIFFERENTIAL/PLATELET
Abs Immature Granulocytes: 0.05 10*3/uL (ref 0.00–0.07)
Basophils Absolute: 0 10*3/uL (ref 0.0–0.1)
Basophils Relative: 0 %
Eosinophils Absolute: 0.1 10*3/uL (ref 0.0–0.5)
Eosinophils Relative: 2 %
HCT: 38.5 % (ref 36.0–46.0)
Hemoglobin: 12.4 g/dL (ref 12.0–15.0)
Immature Granulocytes: 1 %
Lymphocytes Relative: 34 %
Lymphs Abs: 1.6 10*3/uL (ref 0.7–4.0)
MCH: 26.7 pg (ref 26.0–34.0)
MCHC: 32.2 g/dL (ref 30.0–36.0)
MCV: 83 fL (ref 80.0–100.0)
Monocytes Absolute: 0.5 10*3/uL (ref 0.1–1.0)
Monocytes Relative: 9 %
Neutro Abs: 2.6 10*3/uL (ref 1.7–7.7)
Neutrophils Relative %: 54 %
Platelets: 230 10*3/uL (ref 150–400)
RBC: 4.64 MIL/uL (ref 3.87–5.11)
RDW: 12.8 % (ref 11.5–15.5)
WBC: 4.8 10*3/uL (ref 4.0–10.5)
nRBC: 0 % (ref 0.0–0.2)

## 2022-04-09 LAB — BASIC METABOLIC PANEL
Anion gap: 7 (ref 5–15)
BUN: 13 mg/dL (ref 6–20)
CO2: 23 mmol/L (ref 22–32)
Calcium: 9.1 mg/dL (ref 8.9–10.3)
Chloride: 108 mmol/L (ref 98–111)
Creatinine, Ser: 0.96 mg/dL (ref 0.44–1.00)
GFR, Estimated: >60 mL/min (ref >60)
Glucose, Bld: 135 mg/dL — ABNORMAL HIGH (ref 70–99)
Potassium: 3.3 mmol/L — ABNORMAL LOW (ref 3.5–5.1)
Sodium: 138 mmol/L (ref 135–145)

## 2022-04-09 LAB — TROPONIN I (HIGH SENSITIVITY): Troponin I (High Sensitivity): 3 ng/L (ref <18)

## 2022-04-09 NOTE — ED Provider Notes (Signed)
[facility] DEPT Provider Note   CSN: [id number] Arrival date & time: 04/09/22  1905     History  Chief Complaint  Patient presents with   Hypertension    Georgia Delsignore is a 31 y.o. female.  Patient is a 31 year old female who presents with concerns about her blood pressure.  She was recently diagnosed with elevated blood pressure.  She was started on amlodipine about 2 weeks ago.  She has had a lot of anxiety about her blood pressure being elevated and is concerned that she may have a stroke.  She says she has been taking her amlodipine consistently.  She will have some intermittent chest pains and sometimes some abdominal pains.  Sometimes some shortness of breath.  She denies any headaches or dizziness.  She has an appointment with her primary care doctor on October 10.  She has been seen several times in the ED for the symptoms.  Most recently she was seen at [facility] for chest pain and elevated blood pressures.  She had labs which showed a slight drop in her potassium.  She also had a CT scan of her chest which showed no evidence of aortic disease or PE.  This was found on chart review.       Home Medications Prior to Admission medications   Medication Sig Start Date End Date Taking? Authorizing Provider  fluconazole (DIFLUCAN) 150 MG tablet Take 1 tablet (150 mg total) by mouth daily. Patient not taking: Reported on 09/02/2021 09/21/20   Dorie Rank, MD  fluconazole (DIFLUCAN) 150 MG tablet Take 1 tablet (150 mg total) by mouth daily. 09/02/21   Starr Lake, CNM  phenazopyridine (PYRIDIUM) 200 MG tablet Take 1 tablet (200 mg total) by mouth 3 (three) times daily. 09/21/20   Dorie Rank, MD  Prenatal Vit-Fe Fumarate-FA (PRENATAL VITAMINS PO) Take 1 tablet by mouth daily. Patient not taking: Reported on 09/02/2021    [provider]      Allergies    Patient has no known allergies.    Review of Systems   Review of  Systems  Constitutional:  Positive for fatigue. Negative for chills, diaphoresis and fever.  HENT:  Negative for congestion, rhinorrhea and sneezing.   Eyes: Negative.   Respiratory:  Positive for shortness of breath. Negative for cough and chest tightness.   Cardiovascular:  Positive for chest pain. Negative for leg swelling.  Gastrointestinal:  Positive for abdominal pain. Negative for blood in stool, diarrhea, nausea and vomiting.  Genitourinary:  Negative for difficulty urinating, flank pain, frequency and hematuria.  Musculoskeletal:  Negative for arthralgias and back pain.  Skin:  Negative for rash.  Neurological:  Negative for dizziness, speech difficulty, weakness, numbness and headaches.  Psychiatric/Behavioral:  The patient is nervous/anxious.     Physical Exam Updated Vital Signs BP (!) 148/98 (BP Location: Right Arm)   Pulse 97   Temp 98.3 F (36.8 C) (Oral)   Resp 18   Ht 5\' 7"  (1.702 m)   Wt 81.6 kg   LMP 03/01/2022   SpO2 100%   BMI 28.19 kg/m  Physical Exam Constitutional:      Appearance: She is well-developed.  HENT:     Head: Normocephalic and atraumatic.  Eyes:     Pupils: Pupils are equal, round, and reactive to light.  Cardiovascular:     Rate and Rhythm: Normal rate and regular rhythm.     Heart sounds: Normal heart sounds.  Pulmonary:  Effort: Pulmonary effort is normal. No respiratory distress.     Breath sounds: Normal breath sounds. No wheezing or rales.  Chest:     Chest wall: No tenderness.  Abdominal:     General: Bowel sounds are normal.     Palpations: Abdomen is soft.     Tenderness: There is no abdominal tenderness. There is no guarding or rebound.  Musculoskeletal:        General: Normal range of motion.     Cervical back: Normal range of motion and neck supple.  Lymphadenopathy:     Cervical: No cervical adenopathy.  Skin:    General: Skin is warm and dry.     Findings: No rash.  Neurological:     General: No focal deficit  present.     Mental Status: She is alert and oriented to person, place, and time.     Comments: Motor 5/5 all extremities Sensation grossly intact to LT all extremities Finger to Nose intact, no pronator drift CN II-XII grossly intact       ED Results / Procedures / Treatments   Labs (all labs ordered are listed, but only abnormal results are displayed) Labs Reviewed  BASIC METABOLIC PANEL - Abnormal; Notable for the following components:      Result Value   Potassium 3.3 (*)    Glucose, Bld 135 (*)    All other components within normal limits  CBC WITH DIFFERENTIAL/PLATELET  TROPONIN I (HIGH SENSITIVITY)  TROPONIN I (HIGH SENSITIVITY)    EKG EKG Interpretation  Date/Time:  Friday April 09 2022 19:34:18 EDT Ventricular Rate:  92 PR Interval:  175 QRS Duration: 90 QT Interval:  363 QTC Calculation: 449 R Axis:   -20 Text Interpretation: Sinus rhythm Left atrial enlargement Borderline left axis deviation Nonspecific T abnormalities, anterior leads since last tracing no significant change Confirmed by Rolan Bucco [phone]) on 04/09/2022 8:31:28 PM  Radiology No results found.  Procedures Procedures    Medications Ordered in ED Medications - No data to display  ED Course/ Medical Decision Making/ A&P                           Medical Decision Making Amount and/or Complexity of Data Reviewed Labs: ordered.   Patient is a 31 year old female who presents with elevated blood pressure.  She also has been having some intermittent chest pains abdominal pains.  Labs were reviewed and are nonconcerning.  Her EKG does not show any ischemic changes.  I reviewed her old records.  She had a recent CT a of her chest which was nonconcerning.  She seemed very anxious on arrival.  Her blood pressure is actually improved with monitoring in the ED.  Most recently it is 148/98.  Given her prior work-ups and current presentation, I considered AAA, PE, ACS but feel these are unlikely.   She is currently asymptomatic.  Her blood pressure is improved.  I do feel like her anxiety is playing a role in her elevated blood pressures.  At this point I will not make changes to her amlodipine given that just recently started it.  She has an appointment in 4 days to follow-up with her primary care doctor.  Return precautions were given.  Final Clinical Impression(s) / ED Diagnoses Final diagnoses:  Hypertension, unspecified type    Rx / DC Orders ED Discharge Orders     None         Rolan Bucco, MD 04/09/22  2102  

## 2022-04-09 NOTE — ED Triage Notes (Signed)
Ambulatory to ED with c/o hypertension. States she's been taking 5 mg amlodipine daily with minimal change in her BP. Seen in the ED 4 times in the last 2 weeks for same.   Endorses intermittent blurry vision and chest pain when BP is elevated. Unable to get into PCP until the 10th.

## 2022-04-09 NOTE — ED Provider Triage Note (Signed)
Emergency Medicine Provider Triage Evaluation Note  Tamara Cowan , a 31 y.o. female  was evaluated in triage.  Pt complains of elevated blood pressure.  She also has chest pain and some shortness of breath.  She has been seen several times over the last couple weeks for elevated blood pressure and other various complaints.  She recently had an ED chest pain evaluation which included a CT chest which was negative for PE.  She does seem to have a lot of anxiety that contributes to this.  She was recently started on amlodipine but has not yet been able to follow-up with her primary care doctor.  She has an appointment on October 10..  Review of Systems  Positive: Chest pain, shortness of breath, anxiety Negative: Stroke symptoms  Physical Exam  BP (!) 183/118 (BP Location: Left Arm)   Pulse 97   Temp 98.3 F (36.8 C) (Oral)   Resp 18   Ht 5\' 7"  (1.702 m)   Wt 81.6 kg   LMP 03/01/2022   SpO2 100%   BMI 28.19 kg/m  Gen:   Awake, no distress    Resp:  Normal effort   MSK:   Moves extremities without difficulty   Other:     Medical Decision Making  Medically screening exam initiated at 7:23 PM.  Appropriate orders placed.  Niara Bunker was informed that the remainder of the evaluation will be completed by another provider, this initial triage assessment does not replace that evaluation, and the importance of remaining in the ED until their evaluation is complete.      Malvin Johns, MD 04/09/22 1924

## 2022-04-13 ENCOUNTER — Ambulatory Visit: Payer: Medicaid Other | Admitting: Family

## 2022-04-13 ENCOUNTER — Encounter: Payer: Self-pay | Admitting: Family

## 2022-04-13 VITALS — BP 160/110 | HR 95 | Temp 97.5°F | Resp 16 | Ht 67.0 in | Wt 171.8 lb

## 2022-04-13 DIAGNOSIS — R9431 Abnormal electrocardiogram [ECG] [EKG]: Secondary | ICD-10-CM

## 2022-04-13 DIAGNOSIS — I10 Essential (primary) hypertension: Secondary | ICD-10-CM

## 2022-04-13 DIAGNOSIS — Z7689 Persons encountering health services in other specified circumstances: Secondary | ICD-10-CM

## 2022-04-13 MED ORDER — AMLODIPINE BESYLATE 10 MG PO TABS: 10.0000 mg | ORAL_TABLET | Freq: Every day | ORAL | 1 refills | Status: DC

## 2022-04-13 NOTE — Progress Notes (Signed)
Provider: Marlowe Sax FNP-C   Chiana Wamser, Nelda Bucks, NP  Patient Care Team: Breniya Goertzen, Nelda Bucks, NP as PCP - General (Family Medicine)  Extended Emergency Contact Information Primary Emergency Contact: Sallie, Staron Mobile Phone: [phone] Relation: Sister  Code Status:  Full Code  Goals of care: Advanced Directive information    04/13/2022    9:10 AM  Advanced Directives  Does Patient Have a Medical Advance Directive? No  Would patient like information on creating a medical advance directive? No - Patient declined     Chief Complaint  Patient presents with   Establish Care    New Patient.    HPI:  Pt is a 31 y.o. female seen today establish care here at [facility] and Adult  care for medical management of chronic diseases.she was recently seen in the ED for high blood pressure and chest pain thought due to anxiety.    Hypertension - home B/p runs in the 160's/110's  she was given Amlodipine  States currently in school and works full time.Her work schedule varies.Also has a 84 yr old son.  She denies any headache,dizziness,vision changes,fatigue,chest tightness,palpitation,chest pain or shortness of breath.    Usually skips breakfast. Does not exercise.   Past Medical History:  Diagnosis Date   Headache    Preterm labor    UTI (urinary tract infection) 2010   during pregnancy   Past Surgical History:  Procedure Laterality Date   CERVICAL CERCLAGE N/A 09/11/2016   Procedure: CERCLAGE CERVICAL;  Surgeon: Woodroe Mode, MD;  Location: [facility];  Service: Gynecology;  Laterality: N/A;   CERVICAL CERCLAGE N/A 11/21/2019   Procedure: CERCLAGE CERVICAL;  Surgeon: Mora Bellman, MD;  Location: [facility];  Service: Gynecology;  Laterality: N/A;   CESAREAN SECTION N/A 11/17/2016   Procedure: CESAREAN SECTION;  Surgeon: Aletha Halim, MD;  Location: [place];  Service: Obstetrics;  Laterality: N/A;    No Known Allergies  Allergies as of 04/13/2022   No  Known Allergies      Medication List        Accurate as of April 13, 2022  9:29 AM. If you have any questions, ask your nurse or doctor.          STOP taking these medications    fluconazole 150 MG tablet Commonly known as: DIFLUCAN Stopped by: Sandrea Hughs, NP   phenazopyridine 200 MG tablet Commonly known as: PYRIDIUM Stopped by: Sandrea Hughs, NP   PRENATAL VITAMINS PO Stopped by: Sandrea Hughs, NP        Review of Systems  Constitutional:  Negative for appetite change, chills, fatigue, fever and unexpected weight change.  HENT:  Negative for congestion, dental problem, ear discharge, ear pain, facial swelling, hearing loss, nosebleeds, postnasal drip, rhinorrhea, sinus pressure, sinus pain, sneezing, sore throat, tinnitus and trouble swallowing.   Eyes:  Negative for pain, discharge, redness, itching and visual disturbance.  Respiratory:  Negative for cough, chest tightness, shortness of breath and wheezing.   Cardiovascular:  Negative for chest pain, palpitations and leg swelling.  Gastrointestinal:  Negative for abdominal distention, abdominal pain, blood in stool, constipation, diarrhea, nausea and vomiting.  Endocrine: Negative for cold intolerance, heat intolerance, polydipsia, polyphagia and polyuria.  Genitourinary:  Negative for difficulty urinating, dysuria, flank pain, frequency and urgency.  Musculoskeletal:  Negative for arthralgias, back pain, gait problem, joint swelling, myalgias, neck pain and neck stiffness.  Skin:  Negative for color change, pallor, rash and wound.  Neurological:  Negative for dizziness, syncope, speech difficulty, weakness, light-headedness, numbness and headaches.  Hematological:  Does not bruise/bleed easily.  Psychiatric/Behavioral:  Negative for agitation, behavioral problems, confusion, hallucinations, self-injury, sleep disturbance and suicidal ideas. The patient is not nervous/anxious.     Immunization History   Administered Date(s) Administered   HPV 9-valent 06/05/2018, 09/02/2021   Influenza,inj,Quad PF,6+ Mos 06/05/2018   Tdap 11/20/2016   Pertinent  Health Maintenance Due  Topic Date Due   INFLUENZA VACCINE  02/02/2022   PAP SMEAR-Modifier  09/02/2024      09/21/2020    3:15 PM 04/01/2022    8:18 PM 04/02/2022    3:52 PM 04/09/2022    7:14 PM 04/13/2022    9:09 AM  Fall Risk  Falls in the past year?     0  Was there an injury with Fall?     0  Fall Risk Category Calculator     0  Fall Risk Category     Low  Patient Fall Risk Level _0   Patient at Risk for Falls Due to     No Fall Risks  Fall risk Follow up     Falls evaluation completed   Functional Status Survey:    Vitals:   04/13/22 0903  BP: (!) 160/110  Pulse: 95  Resp: 16  Temp: (!) 97.5 F (36.4 C)  SpO2: 95%  Weight: 171 lb 12.8 oz (77.9 kg)  Height: _1  (1.702 m)   Body mass index is 26.91 kg/m. Physical Exam Vitals reviewed.  Constitutional:      General: She is not in acute distress.    Appearance: Normal appearance. She is overweight. She is not ill-appearing or diaphoretic.  HENT:     Head: Normocephalic.     Right Ear: Tympanic membrane, ear canal and external ear normal. There is no impacted cerumen.     Left Ear: Tympanic membrane, ear canal and external ear normal. There is no impacted cerumen.     Nose: Nose normal. No congestion or rhinorrhea.     Mouth/Throat:     Mouth: Mucous membranes are moist.     Pharynx: Oropharynx is clear. No oropharyngeal exudate or posterior oropharyngeal erythema.  Eyes:     General: No scleral icterus.       Right eye: No discharge.        Left eye: No discharge.     Extraocular Movements: Extraocular movements intact.     Conjunctiva/sclera: Conjunctivae normal.     Pupils: Pupils are equal, round, and reactive to light.  Neck:     Vascular: No carotid bruit.  Cardiovascular:     Rate and  Rhythm: Normal rate and regular rhythm.     Pulses: Normal pulses.     Heart sounds: Normal heart sounds. No murmur heard.    No friction rub. No gallop.  Pulmonary:     Effort: Pulmonary effort is normal. No respiratory distress.     Breath sounds: Normal breath sounds. No wheezing, rhonchi or rales.  Chest:     Chest wall: No tenderness.  Abdominal:     General: Bowel sounds are normal. There is no distension.     Palpations: Abdomen is soft. There is no mass.     Tenderness: There is no abdominal tenderness. There is no right CVA tenderness, left CVA tenderness, guarding or rebound.  Musculoskeletal:        General: No  swelling or tenderness. Normal range of motion.     Cervical back: Normal range of motion. No rigidity or tenderness.     Right lower leg: No edema.     Left lower leg: No edema.  Lymphadenopathy:     Cervical: No cervical adenopathy.  Skin:    General: Skin is warm and dry.     Coloration: Skin is not pale.     Findings: No bruising, erythema, lesion or rash.  Neurological:     Mental Status: She is alert and oriented to person, place, and time.     Cranial Nerves: No cranial nerve deficit.     Sensory: No sensory deficit.     Motor: No weakness.     Coordination: Coordination normal.     Gait: Gait normal.  Psychiatric:        Mood and Affect: Mood normal.        Speech: Speech normal.        Behavior: Behavior normal.        Thought Content: Thought content normal.        Judgment: Judgment normal.     Labs reviewed: Recent Labs    04/01/22 2037 04/02/22 1652 04/09/22 1940  NA 139 139 138  K 3.1* 3.1* 3.3*  CL 108 107 108  CO2 _0 GLUCOSE 90 88 135*  BUN _1 CREATININE 0.98 0.93 0.96  CALCIUM 8.8* 8.9 9.1   Recent Labs    04/02/22 1652  AST 20  ALT 15  ALKPHOS 63  BILITOT 0.7  PROT 7.7  ALBUMIN 4.3   Recent Labs    04/01/22 2037 04/02/22 1652 04/09/22 1940  WBC 5.0 4.8 4.8  NEUTROABS  --  2.2 2.6  HGB 12.2 13.3  12.4  HCT 37.7 41.3 38.5  MCV 84.0 83.1 83.0  PLT 238 256 230   No results found for: "TSH" Lab Results  Component Value Date   HGBA1C 5.2 11/19/2019   No results found for: "CHOL", "HDL", "LDLCALC", "LDLDIRECT", "TRIG", "CHOLHDL"  Significant Diagnostic Results in last 30 days:  DG Chest 2 View  Result Date: 04/02/2022 CLINICAL DATA:  Left-sided chest pain for 1 week. EXAM: CHEST - 2 VIEW COMPARISON:  Chest radiographs 04/01/2022 FINDINGS: The cardiomediastinal silhouette is within normal limits. The lungs are well inflated and clear. There is no evidence of pleural effusion or pneumothorax. No acute osseous abnormality is identified. IMPRESSION: No active cardiopulmonary disease. Electronically Signed   By: Logan Bores M.D.   On: 04/02/2022 16:42   DG Chest 2 View  Result Date: 04/01/2022 CLINICAL DATA:  Chest pain EXAM: CHEST - 2 VIEW COMPARISON:  None Available. FINDINGS: The heart size and mediastinal contours are within normal limits. Both lungs are clear. The visualized skeletal structures are unremarkable. IMPRESSION: No active cardiopulmonary disease. Electronically Signed   By: Donavan Foil M.D.   On: 04/01/2022 20:56    Assessment/Plan  1. Encounter to establish care Available medical records reviewed, recommended influenza and COVID-vaccine. Also recommended obtaining fasting blood work.  2. Essential hypertension S/p ED visit for elevated blood pressure was started on amlodipine 5 mg daily.  Blood pressure still elevated this visit advised to increase amlodipine from 5 mg to 10 mg daily - Advised to check Blood pressure at home and record on log provided and notify provider if B/p > 140/90  - Follow up in 2 weeks to recheck B/p  - CBC with Differential/Platelet - CMP with  eGFR([facility]) - amLODipine (NORVASC) 10 MG tablet; Take 1 tablet (10 mg total) by mouth daily.  Dispense: 90 tablet; Refill: 1  3. Abnormal EKG EKG indicates sinus rhythm with left arterial  enlargement nonspecific ST abnormalities heart rate 92 bpm - Ambulatory referral to Cardiology  Family/ staff Communication: Reviewed plan of care with patient verbalized understanding  Labs/tests ordered:  - CBC with Differential/Platelet - CMP with eGFR([facility])  Next Appointment : Return in about 2 weeks (around 04/27/2022) for check Blood pressure and anxiety .   Sandrea Hughs, NP

## 2022-04-14 LAB — CBC WITH DIFFERENTIAL/PLATELET
Absolute Monocytes: 460 cells/uL (ref 200–950)
Basophils Absolute: 19 cells/uL (ref 0–200)
Basophils Relative: 0.5 %
Eosinophils Absolute: 49 cells/uL (ref 15–500)
Eosinophils Relative: 1.3 %
HCT: 36.9 % (ref 35.0–45.0)
Hemoglobin: 12.3 g/dL (ref 11.7–15.5)
Lymphs Abs: 1421 cells/uL (ref 850–3900)
MCH: 27.5 pg (ref 27.0–33.0)
MCHC: 33.3 g/dL (ref 32.0–36.0)
MCV: 82.4 fL (ref 80.0–100.0)
MPV: 11.6 fL (ref 7.5–12.5)
Monocytes Relative: 12.1 %
Neutro Abs: 1851 cells/uL (ref 1500–7800)
Neutrophils Relative %: 48.7 %
Platelets: 239 10*3/uL (ref 140–400)
RBC: 4.48 10*6/uL (ref 3.80–5.10)
RDW: 12.6 % (ref 11.0–15.0)
Total Lymphocyte: 37.4 %
WBC: 3.8 10*3/uL (ref 3.8–10.8)

## 2022-04-14 LAB — COMPLETE METABOLIC PANEL WITH GFR
AG Ratio: 1.7 (calc) (ref 1.0–2.5)
ALT: 13 U/L (ref 6–29)
AST: 14 U/L (ref 10–30)
Albumin: 4.5 g/dL (ref 3.6–5.1)
Alkaline phosphatase (APISO): 58 U/L (ref 31–125)
BUN: 10 mg/dL (ref 7–25)
CO2: 27 mmol/L (ref 20–32)
Calcium: 9.3 mg/dL (ref 8.6–10.2)
Chloride: 105 mmol/L (ref 98–110)
Creat: 0.89 mg/dL (ref 0.50–0.97)
Globulin: 2.7 g/dL (calc) (ref 1.9–3.7)
Glucose, Bld: 86 mg/dL (ref 65–99)
Potassium: 4 mmol/L (ref 3.5–5.3)
Sodium: 138 mmol/L (ref 135–146)
Total Bilirubin: 0.5 mg/dL (ref 0.2–1.2)
Total Protein: 7.2 g/dL (ref 6.1–8.1)
eGFR: 88 mL/min/{1.73_m2} (ref >=60)

## 2022-04-20 NOTE — Telephone Encounter (Signed)
Message routed to Sherrie Mustache, NP. Due to PCP Ngetich, Nelda Bucks, NP being out of office.

## 2022-04-26 NOTE — Telephone Encounter (Signed)
Cardiology referral was ordered already.Cardiologist office will call you to schedule appointment.

## 2022-04-27 ENCOUNTER — Ambulatory Visit: Payer: Medicaid Other | Admitting: Family

## 2022-04-27 ENCOUNTER — Encounter: Payer: Self-pay | Admitting: Family

## 2022-04-27 VITALS — BP 132/90 | HR 91 | Temp 97.5°F | Resp 16 | Ht 67.0 in | Wt 165.6 lb

## 2022-04-27 DIAGNOSIS — I10 Essential (primary) hypertension: Secondary | ICD-10-CM

## 2022-04-27 DIAGNOSIS — R000 Tachycardia, unspecified: Secondary | ICD-10-CM

## 2022-04-27 NOTE — Progress Notes (Signed)
Provider: Marlowe Sax FNP-C  Analyah Cowan, Tamara Bucks, NP  Patient Care Team: Tamara Cowan, Tamara Bucks, NP as PCP - General (Family Medicine)  Extended Emergency Contact Information Primary Emergency Contact: Tamara Cowan, Tamara Cowan Mobile Phone: [phone] Relation: Sister  Code Status:  Full Code  Goals of care: Advanced Directive information    04/27/2022    9:45 AM  Advanced Directives  Does Patient Have a Medical Advance Directive? No  Would patient like information on creating a medical advance directive? No - Patient declined     Chief Complaint  Patient presents with   Follow-up    2 week follow up on blood pressure and anxiety.    HPI:  Pt is a 31 y.o. female seen today for an acute visit for 2 weeks follow up for high blood pressure and anxiety.she was here 04/13/2022 to establish care B/p was 160/110.  Of note she was seen in the ED and was started on amlodipine 5 mg daily and advised to follow-up with PCP.  Her amlodipine was increased to 10 mg when she followed up to establish care and was advised to follow-up today She denies any headache,dizziness,vision changes,fatigue,chest tightness,palpitation,chest pain or shortness of breath.  Blood pressure today has improved.  Past Medical History:  Diagnosis Date   Headache    Preterm labor    UTI (urinary tract infection) 2010   during pregnancy   Past Surgical History:  Procedure Laterality Date   CERVICAL CERCLAGE N/A 09/11/2016   Procedure: CERCLAGE CERVICAL;  Surgeon: Woodroe Mode, MD;  Location: [facility];  Service: Gynecology;  Laterality: N/A;   CERVICAL CERCLAGE N/A 11/21/2019   Procedure: CERCLAGE CERVICAL;  Surgeon: Mora Bellman, MD;  Location: [facility];  Service: Gynecology;  Laterality: N/A;   CESAREAN SECTION N/A 11/17/2016   Procedure: CESAREAN SECTION;  Surgeon: Aletha Halim, MD;  Location: [place];  Service: Obstetrics;  Laterality: N/A;    No Known Allergies  Outpatient Encounter Medications as  of 04/27/2022  Medication Sig   amLODipine (NORVASC) 10 MG tablet Take 1 tablet (10 mg total) by mouth daily.   No facility-administered encounter medications on file as of 04/27/2022.    Review of Systems  Constitutional:  Negative for appetite change, chills, fatigue, fever and unexpected weight change.  HENT:  Negative for congestion, dental problem, ear discharge, ear pain, facial swelling, hearing loss, nosebleeds, postnasal drip, rhinorrhea, sinus pressure, sinus pain, sneezing, sore throat, tinnitus and trouble swallowing.   Eyes:  Negative for pain, discharge, redness, itching and visual disturbance.  Respiratory:  Negative for cough, chest tightness, shortness of breath and wheezing.   Cardiovascular:  Negative for chest pain, palpitations and leg swelling.  Gastrointestinal:  Negative for abdominal distention, abdominal pain, blood in stool, constipation, diarrhea, nausea and vomiting.  Genitourinary:  Negative for difficulty urinating, dysuria, flank pain, frequency and urgency.  Musculoskeletal:  Negative for arthralgias, back pain, gait problem, joint swelling, myalgias, neck pain and neck stiffness.  Skin:  Negative for color change, pallor and rash.  Neurological:  Negative for dizziness, syncope, speech difficulty, weakness, light-headedness, numbness and headaches.  Hematological:  Does not bruise/bleed easily.  Psychiatric/Behavioral:  Negative for agitation, behavioral problems, confusion, hallucinations, self-injury, sleep disturbance and suicidal ideas. The patient is not nervous/anxious.     Immunization History  Administered Date(s) Administered   HPV 9-valent 06/05/2018, 09/02/2021   Influenza,inj,Quad PF,6+ Mos 06/05/2018   Tdap 11/20/2016   Pertinent  Health Maintenance Due  Topic Date Due  INFLUENZA VACCINE  02/02/2022   PAP SMEAR-Modifier  09/02/2024      04/01/2022    8:18 PM 04/02/2022    3:52 PM 04/09/2022    7:14 PM 04/13/2022    9:09 AM 04/27/2022     9:45 AM  Fall Risk  Falls in the past year?    0 0  Was there an injury with Fall?    0 0  Fall Risk Category Calculator    0 0  Fall Risk Category    Low Low  Patient Fall Risk Level Low fall risk Low fall risk Low fall risk Low fall risk Low fall risk  Patient at Risk for Falls Due to    No Fall Risks No Fall Risks  Fall risk Follow up    Falls evaluation completed Falls evaluation completed   Functional Status Survey:    Vitals:   04/27/22 0944  BP: (!) 132/90  Pulse: 91  Resp: 16  Temp: (!) 97.5 F (36.4 C)  SpO2: 91%  Weight: 165 lb 9.6 oz (75.1 kg)  Height: 5\' 7"  (1.702 m)   Body mass index is 25.94 kg/m. Physical Exam Vitals reviewed.  Constitutional:      General: She is not in acute distress.    Appearance: Normal appearance. She is overweight. She is not ill-appearing or diaphoretic.  HENT:     Head: Normocephalic.     Right Ear: Tympanic membrane, ear canal and external ear normal. There is no impacted cerumen.     Left Ear: Tympanic membrane, ear canal and external ear normal. There is no impacted cerumen.     Nose: Nose normal. No congestion or rhinorrhea.     Mouth/Throat:     Mouth: Mucous membranes are moist.     Pharynx: Oropharynx is clear. No oropharyngeal exudate or posterior oropharyngeal erythema.  Eyes:     General: No scleral icterus.       Right eye: No discharge.        Left eye: No discharge.     Extraocular Movements: Extraocular movements intact.     Conjunctiva/sclera: Conjunctivae normal.     Pupils: Pupils are equal, round, and reactive to light.  Neck:     Vascular: No carotid bruit.  Cardiovascular:     Rate and Rhythm: Normal rate and regular rhythm.     Pulses: Normal pulses.     Heart sounds: Normal heart sounds. No murmur heard.    No friction rub. No gallop.  Pulmonary:     Effort: Pulmonary effort is normal. No respiratory distress.     Breath sounds: Normal breath sounds. No wheezing, rhonchi or rales.  Chest:      Chest wall: No tenderness.  Abdominal:     General: Bowel sounds are normal. There is no distension.     Palpations: Abdomen is soft. There is no mass.     Tenderness: There is no abdominal tenderness. There is no right CVA tenderness, left CVA tenderness, guarding or rebound.  Musculoskeletal:        General: No swelling or tenderness. Normal range of motion.     Cervical back: Normal range of motion. No rigidity or tenderness.     Right lower leg: No edema.     Left lower leg: No edema.  Lymphadenopathy:     Cervical: No cervical adenopathy.  Skin:    General: Skin is warm and dry.     Coloration: Skin is not pale.     Findings:  No bruising, erythema, lesion or rash.  Neurological:     Mental Status: She is alert and oriented to person, place, and time.     Cranial Nerves: No cranial nerve deficit.     Sensory: No sensory deficit.     Motor: No weakness.     Coordination: Coordination normal.     Gait: Gait normal.  Psychiatric:        Mood and Affect: Mood normal.        Speech: Speech normal.        Behavior: Behavior normal.        Thought Content: Thought content normal.        Judgment: Judgment normal.     Labs reviewed: Recent Labs    04/02/22 1652 04/09/22 1940 04/13/22 1010  NA 139 138 138  K 3.1* 3.3* 4.0  CL 107 108 105  CO2 25 23 27   GLUCOSE 88 135* 86  BUN 12 13 10   CREATININE 0.93 0.96 0.89  CALCIUM 8.9 9.1 9.3   Recent Labs    04/02/22 1652 04/13/22 1010  AST 20 14  ALT 15 13  ALKPHOS 63  --   BILITOT 0.7 0.5  PROT 7.7 7.2  ALBUMIN 4.3  --    Recent Labs    04/02/22 1652 04/09/22 1940 04/13/22 1010  WBC 4.8 4.8 3.8  NEUTROABS 2.2 2.6 1,851  HGB 13.3 12.4 12.3  HCT 41.3 38.5 36.9  MCV 83.1 83.0 82.4  PLT 256 230 239   No results found for: "TSH" Lab Results  Component Value Date   HGBA1C 5.2 11/19/2019   No results found for: "CHOL", "HDL", "LDLCALC", "LDLDIRECT", "TRIG", "CHOLHDL"  Significant Diagnostic Results in last 30  days:  DG Chest 2 View  Result Date: 04/02/2022 CLINICAL DATA:  Left-sided chest pain for 1 week. EXAM: CHEST - 2 VIEW COMPARISON:  Chest radiographs 04/01/2022 FINDINGS: The cardiomediastinal silhouette is within normal limits. The lungs are well inflated and clear. There is no evidence of pleural effusion or pneumothorax. No acute osseous abnormality is identified. IMPRESSION: No active cardiopulmonary disease. Electronically Signed   By: 04/04/2022 M.D.   On: 04/02/2022 16:42   DG Chest 2 View  Result Date: 04/01/2022 CLINICAL DATA:  Chest pain EXAM: CHEST - 2 VIEW COMPARISON:  None Available. FINDINGS: The heart size and mediastinal contours are within normal limits. Both lungs are clear. The visualized skeletal structures are unremarkable. IMPRESSION: No active cardiopulmonary disease. Electronically Signed   By: 04/04/2022 M.D.   On: 04/01/2022 20:56    Assessment/Plan 1. Essential hypertension Blood pressure has improved -Continue on amlodipine 10 mg tablet daily - Advised to check Blood pressure at home and record on log provided and notify provider if B/p > 140/90  - EKG 12-Lead indicates Sinus Tachycardia  HR 105 b/min changes noted compared to previous EKG done 04/09/2022 which indicated sinus rhythm with left arterial enlargement borderline left axis deviation nonspecific T abnormalities anterior leads heart rate 92 bpm  2. Sinus tachycardia EKG changes compared to previous as above. Will check TSH level  Refer to cardiology for evaluation. - Ambulatory referral to Cardiology - TSH  Family/ staff Communication: Reviewed plan of care with patient verbalized understanding  Labs/tests ordered:  - TSH  Next Appointment: Return in about 4 months (around 08/28/2022) for medical mangement of chronic issues., fasting labs prior to visit.  06/09/2022, NP

## 2022-04-28 LAB — TSH: TSH: 1.25 mIU/L

## 2022-05-10 NOTE — Telephone Encounter (Signed)
Recommend evaluation in Emergency room if having chest pains.

## 2022-05-11 ENCOUNTER — Other Ambulatory Visit: Payer: Self-pay

## 2022-05-11 ENCOUNTER — Emergency Department (HOSPITAL_COMMUNITY): Payer: Medicaid Other

## 2022-05-11 ENCOUNTER — Encounter (HOSPITAL_COMMUNITY): Payer: Self-pay | Admitting: Emergency Medicine

## 2022-05-11 ENCOUNTER — Emergency Department (HOSPITAL_COMMUNITY)
Admission: EM | Admit: 2022-05-11 | Discharge: 2022-05-11 | Discharge status: Against Medical Advice | Payer: Medicaid Other | Attending: Emergency Medicine | Admitting: Emergency Medicine

## 2022-05-11 DIAGNOSIS — R200 Anesthesia of skin: Secondary | ICD-10-CM | POA: Insufficient documentation

## 2022-05-11 DIAGNOSIS — R079 Chest pain, unspecified: Secondary | ICD-10-CM | POA: Insufficient documentation

## 2022-05-11 DIAGNOSIS — Z5321 Procedure and treatment not carried out due to patient leaving prior to being seen by health care provider: Secondary | ICD-10-CM | POA: Diagnosis not present

## 2022-05-11 HISTORY — DX: Essential (primary) hypertension: I10

## 2022-05-11 LAB — CBC
HCT: 41.7 % (ref 36.0–46.0)
Hemoglobin: 13.3 g/dL (ref 12.0–15.0)
MCH: 26.9 pg (ref 26.0–34.0)
MCHC: 31.9 g/dL (ref 30.0–36.0)
MCV: 84.4 fL (ref 80.0–100.0)
Platelets: 253 10*3/uL (ref 150–400)
RBC: 4.94 MIL/uL (ref 3.87–5.11)
RDW: 12.6 % (ref 11.5–15.5)
WBC: 3.2 10*3/uL — ABNORMAL LOW (ref 4.0–10.5)
nRBC: 0 % (ref 0.0–0.2)

## 2022-05-11 LAB — BASIC METABOLIC PANEL
Anion gap: 14 (ref 5–15)
BUN: 11 mg/dL (ref 6–20)
CO2: 19 mmol/L — ABNORMAL LOW (ref 22–32)
Calcium: 9.1 mg/dL (ref 8.9–10.3)
Chloride: 107 mmol/L (ref 98–111)
Creatinine, Ser: 0.93 mg/dL (ref 0.44–1.00)
GFR, Estimated: >60 mL/min (ref >60)
Glucose, Bld: 77 mg/dL (ref 70–99)
Potassium: 3.5 mmol/L (ref 3.5–5.1)
Sodium: 140 mmol/L (ref 135–145)

## 2022-05-11 LAB — TROPONIN I (HIGH SENSITIVITY): Troponin I (High Sensitivity): <2 ng/L (ref <18)

## 2022-05-11 NOTE — ED Provider Triage Note (Signed)
Emergency Medicine Provider Triage Evaluation Note  Tamara Cowan , a 31 y.o. female  was evaluated in triage.  Pt complains of chest pain and left arm numbness.  Patient states she has been seen multiple times for similar symptoms and has been through multiple thorough work-ups.  She has recently initiated blood pressure medications.  She states her chest pain and abdominal move is intermittent.  States it has been present since she woke up this morning.  Describes the chest pain as a pressure and rates it at a 4 out of 10.  Has not tried anything at home.  Denies fevers, chills, nausea, vomiting, shortness of breath.  Review of Systems  Positive: As above Negative: As above  Physical Exam  BP (!) 147/101 (BP Location: Right Arm)   Pulse 97   Temp 99.1 F (37.3 C) (Oral)   Resp 16   Ht 5\' 7"  (1.702 m)   Wt 72.6 kg   LMP 04/06/2022   SpO2 100%   BMI 25.06 kg/m  Gen:   Awake, no distress   Resp:  Normal effort CTA B MSK:   Moves extremities without difficulty  Other:    Medical Decision Making  Medically screening exam initiated at 11:04 AM.  Appropriate orders placed.  Tamara Cowan was informed that the remainder of the evaluation will be completed by another provider, this initial triage assessment does not replace that evaluation, and the importance of remaining in the ED until their evaluation is complete.  ED ACS work-up initiated.  Patient also requested urinalysis.   Roylene Reason, [place] 05/11/22 1105

## 2022-05-11 NOTE — ED Notes (Signed)
Pt stated she will wait to see her dr on Thursday was tired of sitting.

## 2022-05-11 NOTE — ED Triage Notes (Signed)
Pt. Stated, I have several issues so Im not sure, this all started about 2 months ago. I have arm numbness, high BP, and some chest pain.

## 2022-05-11 NOTE — ED Notes (Signed)
Pt's stroke screen is negative

## 2022-05-12 ENCOUNTER — Ambulatory Visit (HOSPITAL_BASED_OUTPATIENT_CLINIC_OR_DEPARTMENT_OTHER): Payer: Medicaid Other | Admitting: Family Medicine

## 2022-05-13 ENCOUNTER — Telehealth: Payer: Self-pay

## 2022-05-13 ENCOUNTER — Ambulatory Visit: Payer: Medicaid Other | Admitting: Internal Medicine

## 2022-05-13 NOTE — Telephone Encounter (Signed)
Transition Care Management Follow-up Telephone Call Date of discharge and from where: 10/09/2021, [facility] How have you been since you were released from the hospital? Patient feeling much better Any questions or concerns? No  Items Reviewed: Did the pt receive and understand the discharge instructions provided? Yes  Medications obtained and verified? Yes  Other? No  Any new allergies since your discharge? No  Dietary orders reviewed? No Do you have support at home? Yes   Home Care and Equipment/Supplies: Were home health services ordered? not applicable If so, what is the name of the agency? N/A  Has the agency set up a time to come to the patient's home? not applicable Were any new equipment or medical supplies ordered?  No What is the name of the medical supply agency? N/A Were you able to get the supplies/equipment? not applicable Do you have any questions related to the use of the equipment or supplies? No  Functional Questionnaire: (I = Independent and D = Dependent) ADLs: I  Bathing/Dressing- I  Meal Prep- I  Eating- I  Maintaining continence- I  Transferring/Ambulation- I  Managing Meds- I  Follow up appointments reviewed:  PCP Hospital f/u appt confirmed? Yes  Scheduled to see Ngetich, Donalee Citrin, NP  on 05/18/2022 @ 940AM. Specialist Hospital f/u appt confirmed? No  Scheduled to see n/A on n/a @ N/A. Are transportation arrangements needed? No  If their condition worsens, is the pt aware to call PCP or go to the Emergency Dept.? Yes Was the patient provided with contact information for the PCP's office or ED? Yes Was to pt encouraged to call back with questions or concerns? Yes

## 2022-05-17 ENCOUNTER — Ambulatory Visit: Payer: Medicaid Other | Admitting: Nurse Practitioner

## 2022-05-18 ENCOUNTER — Ambulatory Visit: Payer: Medicaid Other | Admitting: Family

## 2022-05-18 ENCOUNTER — Encounter: Payer: Self-pay | Admitting: Family

## 2022-05-18 VITALS — BP 138/90 | HR 84 | Temp 98.1°F | Resp 16 | Ht 67.0 in | Wt 162.8 lb

## 2022-05-18 DIAGNOSIS — I10 Essential (primary) hypertension: Secondary | ICD-10-CM | POA: Diagnosis not present

## 2022-05-18 NOTE — Progress Notes (Signed)
Provider: Richarda Blade FNP-C  Jamiee Milholland, Donalee Citrin, NP  Patient Care Team: Haven Pylant, Donalee Citrin, NP as PCP - General (Family Medicine)  Extended Emergency Contact Information Primary Emergency Contact: Shristi, Scheib Mobile Phone: [phone] Relation: Sister  Code Status:  Full Code  Goals of care: Advanced Directive information    05/18/2022    9:13 AM  Advanced Directives  Does Patient Have a Medical Advance Directive? No  Would patient like information on creating a medical advance directive? No - Patient declined     Chief Complaint  Patient presents with   Hospitalization Follow-up    Hospital follow up 05/11/2022.    HPI:  Pt is a 31 y.o. female seen today for an acute visit for Follow up ED visit for chest pain.she was seen at [facility] ED on 05/11/2022 Lab work done was unremarkable including troponin which was negative.Chest X-ray was normal.EKG indicated normal sinus rhythm with non-specific ST and T wave abnormality.Patient left ED due to prolong waiting.  Of note,patient was here  she was here on 04/13/2022 to establish care B/p was 160/110 Amlodipine was increased from 5 mg to 10 mg tablet daily.Her EKG done indicated SR with left arterial enlargement and nonspecific ST abnormalities HR 92.Cardiology Referral was ordered. She had another F/u on 04/27/2022 HR was 105 b/min EKG indicated Sinus Tachycardia.TSH level was normal. States her home B/p readings runs in the 120's-130's and diastolic runs in the 8o's-90's.States headaches have resolved. Sometimes has left tingling sensation on left arm when active. Tends to worry whether everything is okay.  denies any headache,dizziness,vision changes,fatigue,chest tightness,palpitation,chest pain or shortness of breath.    Has changed her diet.weight loss noted this visit. Has not started any form of exercise but plans to.Discuss walking exercises.      Past Medical History:  Diagnosis Date   Headache     Hypertension    Preterm labor    UTI (urinary tract infection) 2010   during pregnancy   Past Surgical History:  Procedure Laterality Date   CERVICAL CERCLAGE N/A 09/11/2016   Procedure: CERCLAGE CERVICAL;  Surgeon: Adam Phenix, MD;  Location: [facility];  Service: Gynecology;  Laterality: N/A;   CERVICAL CERCLAGE N/A 11/21/2019   Procedure: CERCLAGE CERVICAL;  Surgeon: Catalina Antigua, MD;  Location: [facility];  Service: Gynecology;  Laterality: N/A;   CESAREAN SECTION N/A 11/17/2016   Procedure: CESAREAN SECTION;  Surgeon: D'Lo Bing, MD;  Location: [facility];  Service: Obstetrics;  Laterality: N/A;    No Known Allergies  Outpatient Encounter Medications as of 05/18/2022  Medication Sig   amLODipine (NORVASC) 10 MG tablet Take 1 tablet (10 mg total) by mouth daily.   No facility-administered encounter medications on file as of 05/18/2022.    Review of Systems  Constitutional:  Negative for appetite change, chills, fatigue, fever and unexpected weight change.  HENT:  Negative for congestion, hearing loss, nosebleeds, postnasal drip, rhinorrhea, sinus pressure, sinus pain, sneezing, sore throat, tinnitus and trouble swallowing.   Eyes:  Negative for pain, discharge, redness, itching and visual disturbance.  Respiratory:  Negative for cough, chest tightness, shortness of breath and wheezing.   Cardiovascular:  Negative for chest pain, palpitations and leg swelling.  Gastrointestinal:  Negative for abdominal distention, abdominal pain, blood in stool, constipation, diarrhea, nausea and vomiting.  Musculoskeletal:  Negative for arthralgias, back pain, gait problem, joint swelling, myalgias, neck pain and neck stiffness.  Skin:  Negative for color change, pallor and rash.  Neurological:  Negative for dizziness, syncope, speech difficulty, weakness, light-headedness, numbness and headaches.  Hematological:  Does not bruise/bleed easily.  Psychiatric/Behavioral:  Negative  for agitation, behavioral problems, confusion, hallucinations and sleep disturbance. The patient is not nervous/anxious.     Immunization History  Administered Date(s) Administered   HPV 9-valent 06/05/2018, 09/02/2021   Influenza,inj,Quad PF,6+ Mos 06/05/2018   Tdap 11/20/2016   Pertinent  Health Maintenance Due  Topic Date Due   INFLUENZA VACCINE  02/02/2022   PAP SMEAR-Modifier  09/02/2024      04/13/2022    9:09 AM 04/27/2022    9:45 AM 05/11/2022   11:01 AM 05/11/2022   11:03 AM 05/18/2022    9:13 AM  Fall Risk  Falls in the past year? 0 0   0  Was there an injury with Fall? 0 0   0  Fall Risk Category Calculator 0 0   0  Fall Risk Category Low Low   Low  Patient Fall Risk Level Low fall risk Low fall risk Low fall risk Low fall risk Low fall risk  Patient at Risk for Falls Due to No Fall Risks No Fall Risks   No Fall Risks  Fall risk Follow up Falls evaluation completed Falls evaluation completed   Falls evaluation completed   Functional Status Survey:    Vitals:   05/18/22 0907  BP: (!) 138/90  Pulse: 84  Resp: 16  Temp: 98.1 F (36.7 C)  SpO2: 99%  Weight: 162 lb 12.8 oz (73.8 kg)  Height: 5\' 7"  (1.702 m)   Body mass index is 25.5 kg/m. Physical Exam Vitals reviewed.  Constitutional:      General: She is not in acute distress.    Appearance: Normal appearance. She is overweight. She is not ill-appearing or diaphoretic.  HENT:     Head: Normocephalic.     Right Ear: Tympanic membrane, ear canal and external ear normal. There is no impacted cerumen.     Left Ear: Tympanic membrane, ear canal and external ear normal. There is no impacted cerumen.     Nose: Nose normal. No congestion or rhinorrhea.     Mouth/Throat:     Mouth: Mucous membranes are moist.     Pharynx: Oropharynx is clear. No oropharyngeal exudate or posterior oropharyngeal erythema.  Eyes:     General: No scleral icterus.       Right eye: No discharge.        Left eye: No discharge.      Extraocular Movements: Extraocular movements intact.     Conjunctiva/sclera: Conjunctivae normal.     Pupils: Pupils are equal, round, and reactive to light.  Neck:     Vascular: No carotid bruit.  Cardiovascular:     Rate and Rhythm: Normal rate and regular rhythm.     Pulses: Normal pulses.     Heart sounds: Normal heart sounds. No murmur heard.    No friction rub. No gallop.  Pulmonary:     Effort: Pulmonary effort is normal. No respiratory distress.     Breath sounds: Normal breath sounds. No wheezing, rhonchi or rales.  Chest:     Chest wall: No tenderness.  Abdominal:     General: Bowel sounds are normal. There is no distension.     Palpations: Abdomen is soft. There is no mass.     Tenderness: There is no abdominal tenderness. There is no right CVA tenderness, left CVA tenderness, guarding or rebound.  Musculoskeletal:  General: No swelling or tenderness. Normal range of motion.     Cervical back: Normal range of motion. No rigidity or tenderness.     Right lower leg: No edema.     Left lower leg: No edema.  Lymphadenopathy:     Cervical: No cervical adenopathy.  Skin:    General: Skin is warm and dry.     Coloration: Skin is not pale.     Findings: No bruising, erythema, lesion or rash.  Neurological:     Mental Status: She is alert and oriented to person, place, and time.     Cranial Nerves: No cranial nerve deficit.     Sensory: No sensory deficit.     Motor: No weakness.     Coordination: Coordination normal.     Gait: Gait normal.  Psychiatric:        Mood and Affect: Mood normal.        Speech: Speech normal.        Behavior: Behavior normal.        Thought Content: Thought content normal.        Judgment: Judgment normal.     Labs reviewed: Recent Labs    04/09/22 1940 04/13/22 1010 05/11/22 1119  NA 138 138 140  K 3.3* 4.0 3.5  CL 108 105 107  CO2 23 27 19*  GLUCOSE 135* 86 77  BUN 13 10 11   CREATININE 0.96 0.89 0.93  CALCIUM 9.1 9.3 9.1    Recent Labs    04/02/22 1652 04/13/22 1010  AST 20 14  ALT 15 13  ALKPHOS 63  --   BILITOT 0.7 0.5  PROT 7.7 7.2  ALBUMIN 4.3  --    Recent Labs    04/02/22 1652 04/09/22 1940 04/13/22 1010 05/11/22 1119  WBC 4.8 4.8 3.8 3.2*  NEUTROABS 2.2 2.6 1,851  --   HGB 13.3 12.4 12.3 13.3  HCT 41.3 38.5 36.9 41.7  MCV 83.1 83.0 82.4 84.4  PLT 256 230 239 253   Lab Results  Component Value Date   TSH 1.25 04/27/2022   Lab Results  Component Value Date   HGBA1C 5.2 11/19/2019   No results found for: "CHOL", "HDL", "LDLCALC", "LDLDIRECT", "TRIG", "CHOLHDL"  Significant Diagnostic Results in last 30 days:  DG Chest 2 View  Result Date: 05/11/2022 CLINICAL DATA:  High blood pressure. Tingling in left arm and neck for several months. EXAM: CHEST - 2 VIEW COMPARISON:  04/02/2022 FINDINGS: The heart size and mediastinal contours are within normal limits. Both lungs are clear. The visualized skeletal structures are unremarkable. IMPRESSION: No active cardiopulmonary disease. Electronically Signed   By: 04/04/2022 M.D.   On: 05/11/2022 11:31    Assessment/Plan  Essential hypertension B/p has improved except Diastolic still elevated  - home SBP runs in the 120's-130's  - continue on Amlodipine  - Follow up with Cardiology as schedule  Family/ staff Communication: Reviewed plan of care with patient verbalized understanding   Labs/tests ordered: None   Next Appointment: Return if symptoms worsen or fail to improve.   13/01/2022, NP

## 2022-05-18 NOTE — Patient Instructions (Signed)
-   Follow up with cardiology as schedule

## 2022-05-19 ENCOUNTER — Encounter: Payer: Self-pay | Admitting: Internal Medicine

## 2022-05-19 ENCOUNTER — Ambulatory Visit: Payer: Medicaid Other | Attending: Cardiovascular Disease | Admitting: Internal Medicine

## 2022-05-19 VITALS — BP 138/88 | HR 96 | Ht 67.0 in | Wt 163.0 lb

## 2022-05-19 DIAGNOSIS — R000 Tachycardia, unspecified: Secondary | ICD-10-CM | POA: Diagnosis not present

## 2022-05-19 NOTE — Patient Instructions (Signed)
Medication Instructions:  Your physician recommends that you continue on your current medications as directed. Please refer to the Current Medication list given to you today.   *If you need a refill on your cardiac medications before your next appointment, please call your pharmacy*   Lab Work: NONE ordered at this time of appointment   If you have labs (blood work) drawn today and your tests are completely normal, you will receive your results only by: MyChart Message (if you have MyChart) OR A paper copy in the mail If you have any lab test that is abnormal or we need to change your treatment, we will call you to review the results.   Testing/Procedures: NONE ordered at this time of appointment     Follow-Up: At [facility], you and your health needs are our priority.  As part of our continuing mission to provide you with exceptional heart care, we have created designated Provider Care Teams.  These Care Teams include your primary Cardiologist (physician) and Advanced Practice Providers (APPs -  Physician Assistants and Nurse Practitioners) who all work together to provide you with the care you need, when you need it.  We recommend signing up for the patient portal called "MyChart".  Sign up information is provided on this After Visit Summary.  MyChart is used to connect with patients for Virtual Visits (Telemedicine).  Patients are able to view lab/test results, encounter notes, upcoming appointments, etc.  Non-urgent messages can be sent to your provider as well.   To learn more about what you can do with MyChart, go to [email or web address].    Your next appointment:   3 month(s)  The format for your next appointment:   In Person  Provider:   None     Other Instructions Monitor Blood Pressure once daily for 1 week Discuss with your Primary Care Doctor Migraine & anxiety management   Important Information About Sugar

## 2022-05-19 NOTE — Progress Notes (Signed)
Cardiology Office Note:    Date:  05/19/2022   ID:  Blaine Guiffre, DOB Jan 20, 1991, MRN [id number]  PCP:  Caesar Bookman, NP   [facility] Providers Cardiologist:  None     Referring MD: Caesar Bookman, NP   No chief complaint on file. HTN  History of Present Illness:    Tamara Cowan is a 31 y.o. female with a hx of HTN, ED referral for CP. She had a dizzy spell at [facility] and then things got worse. Had hypertensive Bps 160/110 mmhg. Hope Bps normal.  ECG showed mild sinus tachycardia. TSH nl. Normal crt. Normal Hgb. Was seen at [facility], negative CT PE for PE. Ecg shows NSR. The dizzy spell happened once. Had palpitations with coffee. This is all new. This has resolved. She notes migraine symptoms. Had headache, and arm numbness. Notes anxiousness.  Past Medical History:  Diagnosis Date   Headache    Hypertension    Preterm labor    UTI (urinary tract infection) 2010   during pregnancy    Past Surgical History:  Procedure Laterality Date   CERVICAL CERCLAGE N/A 09/11/2016   Procedure: CERCLAGE CERVICAL;  Surgeon: Adam Phenix, MD;  Location: [facility];  Service: Gynecology;  Laterality: N/A;   CERVICAL CERCLAGE N/A 11/21/2019   Procedure: CERCLAGE CERVICAL;  Surgeon: Catalina Antigua, MD;  Location: [facility];  Service: Gynecology;  Laterality: N/A;   CESAREAN SECTION N/A 11/17/2016   Procedure: CESAREAN SECTION;  Surgeon: Crofton Bing, MD;  Location: [facility]IRTHING SUIT[facility];  Service: Obstetrics;  Laterality: N/A;    Current Medications: Current Meds  Medication Sig   amLODipine (NORVASC) 10 MG tablet Take 1 tablet (10 mg total) by mouth daily.     Allergies:   Patient has no known allergies.   Social History   Socioeconomic History   Marital status: Single    Spouse name: Not on file   Number of children: Not on file   Years of education: Not on file   Highest education level: Not on file  Occupational History   Not on file  Tobacco Use   Smoking  status: Former    Years: 0.50    Types: Cigarettes    Quit date: 10/24/2018    Years since quitting: 3.5   Smokeless tobacco: Never   Tobacco comments:    1-2 PER DAY  Vaping Use   Vaping Use: Never used  Substance and Sexual Activity   Alcohol use: Not Currently    Alcohol/week: 1.0 standard drink of alcohol    Types: 1 Glasses of wine per week    Comment: occasionally   Drug use: No   Sexual activity: Yes    Birth control/protection: None  Other Topics Concern   Not on file  Social History Narrative   Not on file   Social Determinants of Health   Financial Resource Strain: Not on file  Food Insecurity: No Food Insecurity (09/02/2021)   Hunger Vital Sign    Worried About Running Out of Food in the Last Year: Never true    Ran Out of Food in the Last Year: Never true  Transportation Needs: No Transportation Needs (09/02/2021)   PRAPARE - Administrator, Civil Service (Medical): No    Lack of Transportation (Non-Medical): No  Physical Activity: Not on file  Stress: Not on file  Social Connections: Not on file     Family History: [name] family history includes Heart disease in her  father; Hypertension in her father.  ROS:   Please see the history of present illness.     All other systems reviewed and are negative.  EKGs/Labs/Other Studies Reviewed:    The following studies were reviewed today:   EKG:  EKG is  ordered today.  The ekg ordered today demonstrates   05/19/2022- NSR, non specific T wave changes  Recent Labs: 04/01/2022: B Natriuretic Peptide 22.5 04/13/2022: ALT 13 04/27/2022: TSH 1.25 05/11/2022: BUN 11; Creatinine, Ser 0.93; Hemoglobin 13.3; Platelets 253; Potassium 3.5; Sodium 140   Recent Lipid Panel No results found for: "CHOL", "TRIG", "HDL", "CHOLHDL", "VLDL", "LDLCALC", "LDLDIRECT"   Risk Assessment/Calculations:     Physical Exam:    VS:   Vitals:   05/19/22 1114  BP: 138/88  Pulse: 96  SpO2: 100%    BP 138/88    Pulse 96   Ht 5\' 7"  (1.702 m)   Wt 163 lb (73.9 kg)   LMP 04/06/2022   SpO2 100%   BMI 25.53 kg/m     Wt Readings from Last 3 Encounters:  05/19/22 163 lb (73.9 kg)  05/18/22 162 lb 12.8 oz (73.8 kg)  05/11/22 160 lb (72.6 kg)     GEN:  Well nourished, well developed in no acute distress HEENT: Normal NECK: No JVD; No carotid bruits LYMPHATICS: No lymphadenopathy CARDIAC: RRR, no murmurs, rubs, gallops RESPIRATORY:  Clear to auscultation without rales, wheezing or rhonchi  ABDOMEN: Soft, non-tender, non-distended MUSCULOSKELETAL:  No edema; No deformity  SKIN: Warm and dry NEUROLOGIC:  Alert and oriented x 3 PSYCHIATRIC:  Normal affect   ASSESSMENT:    ?HTN: is concerned about taking medications. BP ok today but prefer she monitors at home.  Contributing factors include migraines and anxiety. Plan is to check BP for one week, then can taper norvasc and trial off BP meds to see if addressing underlying issues such as migraines and anxiety will improve her BP. If not, can plan to continue BP therapy until these issues are addressed. Will also plan for renal 13/07/23 if Bps remain elevated. Recommend close follow-up with her PCP.  PLAN:    In order of problems listed above:  Follow up 3 months       Medication Adjustments/Labs and Tests Ordered: Current medicines are reviewed at length with the patient today.  Concerns regarding medicines are outlined above.  Orders Placed This Encounter  Procedures   EKG 12-Lead   No orders of the defined types were placed in this encounter.   Patient Instructions  Medication Instructions:  Your physician recommends that you continue on your current medications as directed. Please refer to the Current Medication list given to you today.   *If you need a refill on your cardiac medications before your next appointment, please call your pharmacy*   Lab Work: NONE ordered at this time of appointment   If you have labs (blood work)  drawn today and your tests are completely normal, you will receive your results only by: MyChart Message (if you have MyChart) OR A paper copy in the mail If you have any lab test that is abnormal or we need to change your treatment, we will call you to review the results.   Testing/Procedures: NONE ordered at this time of appointment     Follow-Up: At [facility], you and your health needs are our priority.  As part of our continuing mission to provide you with exceptional heart care, we have created designated Provider Care Teams.  These Care Teams include your primary Cardiologist (physician) and Advanced Practice Providers (APPs -  Physician Assistants and Nurse Practitioners) who all work together to provide you with the care you need, when you need it.  We recommend signing up for the patient portal called "MyChart".  Sign up information is provided on this After Visit Summary.  MyChart is used to connect with patients for Virtual Visits (Telemedicine).  Patients are able to view lab/test results, encounter notes, upcoming appointments, etc.  Non-urgent messages can be sent to your provider as well.   To learn more about what you can do with MyChart, go to [email or web address].    Your next appointment:   3 month(s)  The format for your next appointment:   In Person  Provider:   None     Other Instructions Monitor Blood Pressure once daily for 1 week Discuss with your Primary Care Doctor Migraine & anxiety management   Important Information About Sugar         Signed, Maisie Fus, MD  05/19/2022 1:31 PM    [facility]

## 2022-05-21 ENCOUNTER — Ambulatory Visit: Payer: Medicaid Other | Admitting: Cardiovascular Disease

## 2022-06-01 ENCOUNTER — Ambulatory Visit: Payer: Medicaid Other | Attending: Internal Medicine | Admitting: Internal Medicine

## 2022-06-01 ENCOUNTER — Encounter: Payer: Self-pay | Admitting: Internal Medicine

## 2022-06-01 VITALS — BP 146/98 | HR 86 | Ht 67.0 in | Wt 164.5 lb

## 2022-06-01 DIAGNOSIS — I10 Essential (primary) hypertension: Secondary | ICD-10-CM

## 2022-06-01 MED ORDER — CHLORTHALIDONE 25 MG PO TABS: 25.0000 mg | ORAL_TABLET | Freq: Every day | ORAL | 1 refills | Status: DC

## 2022-06-01 NOTE — Patient Instructions (Signed)
Medication Instructions:   START: AMLODIPINE 10mg  ONCE DAILY   START: CHLORTHALIDONE 25mg  ONCE DAILY   *If you need a refill on your cardiac medications before your next appointment, please call your pharmacy*  Lab Work:  Please return for Blood Work in 2 WEEKS. No appointment needed, lab here at the office is open Monday-Friday from 8AM to 4PM and closed daily for lunch from 12:45-1:45.   If you have labs (blood work) drawn today and your tests are completely normal, you will receive your results only by: MyChart Message (if you have MyChart) OR A paper copy in the mail If you have any lab test that is abnormal or we need to change your treatment, we will call you to review the results.  Testing/Procedures: Your physician has requested that you have a renal artery duplex. During this test, an ultrasound is used to evaluate blood flow to the kidneys. Allow one hour for this exam. Do not eat after midnight the day before and avoid carbonated beverages. Take your medications as you usually do.  Follow-Up: At [facility], you and your health needs are our priority.  As part of our continuing mission to provide you with exceptional heart care, we have created designated Provider Care Teams.  These Care Teams include your primary Cardiologist (physician) and Advanced Practice Providers (APPs -  Physician Assistants and Nurse Practitioners) who all work together to provide you with the care you need, when you need it.  Your next appointment:   6 month(s)  The format for your next appointment:   In Person  Provider:   , MD

## 2022-06-01 NOTE — Progress Notes (Signed)
Cardiology Office Note:    Date:  06/01/2022   ID:  Tamara Cowan, DOB 27-Jan-1991, MRN [id number]  PCP:  Caesar Bookman, NP   [facility] Providers Cardiologist:  None     Referring MD: Caesar Bookman, NP   No chief complaint on file. HTN  History of Present Illness:    Tamara Cowan is a 31 y.o. female with a hx of HTN, ED referral for CP. She had a dizzy spell at [facility] and then things got worse. Had hypertensive Bps 160/110 mmhg. Hope Bps normal.  ECG showed mild sinus tachycardia. TSH nl. Normal crt. Normal Hgb. Was seen at [facility], negative CT PE for PE. Ecg shows NSR. The dizzy spell happened once. Had palpitations with coffee. This is all new. This has resolved. She notes migraine symptoms. Had headache, and arm numbness. Notes anxiousness.  Interim Hx 06/01/2022 Her blood pressure increased. Off the amlodipine for two weeks.  Past Medical History:  Diagnosis Date   Headache    Hypertension    Preterm labor    UTI (urinary tract infection) 2010   during pregnancy    Past Surgical History:  Procedure Laterality Date   CERVICAL CERCLAGE N/A 09/11/2016   Procedure: CERCLAGE CERVICAL;  Surgeon: Adam Phenix, MD;  Location: [facility];  Service: Gynecology;  Laterality: N/A;   CERVICAL CERCLAGE N/A 11/21/2019   Procedure: CERCLAGE CERVICAL;  Surgeon: Catalina Antigua, MD;  Location: [facility];  Service: Gynecology;  Laterality: N/A;   CESAREAN SECTION N/A 11/17/2016   Procedure: CESAREAN SECTION;  Surgeon: Magoffin Bing, MD;  Location: [facility];  Service: Obstetrics;  Laterality: N/A;    Current Medications: Current Meds  Medication Sig   amLODipine (NORVASC) 10 MG tablet Take 1 tablet (10 mg total) by mouth daily.     Allergies:   Patient has no known allergies.   Social History   Socioeconomic History   Marital status: Single    Spouse name: Not on file   Number of children: Not on file   Years of education: Not on file   Highest  education level: Not on file  Occupational History   Not on file  Tobacco Use   Smoking status: Former    Years: 0.50    Types: Cigarettes    Quit date: 10/24/2018    Years since quitting: 3.6   Smokeless tobacco: Never   Tobacco comments:    1-2 PER DAY  Vaping Use   Vaping Use: Never used  Substance and Sexual Activity   Alcohol use: Not Currently    Alcohol/week: 1.0 standard drink of alcohol    Types: 1 Glasses of wine per week    Comment: occasionally   Drug use: No   Sexual activity: Yes    Birth control/protection: None  Other Topics Concern   Not on file  Social History Narrative   Not on file   Social Determinants of Health   Financial Resource Strain: Not on file  Food Insecurity: No Food Insecurity (09/02/2021)   Hunger Vital Sign    Worried About Running Out of Food in the Last Year: Never true    Ran Out of Food in the Last Year: Never true  Transportation Needs: No Transportation Needs (09/02/2021)   PRAPARE - Administrator, Civil Service (Medical): No    Lack of Transportation (Non-Medical): No  Physical Activity: Not on file  Stress: Not on file  Social Connections: Not on file  Family History: [name] family history includes Heart disease in her father; Hypertension in her father.  ROS:   Please see the history of present illness.     All other systems reviewed and are negative.  EKGs/Labs/Other Studies Reviewed:    The following studies were reviewed today:   EKG:  EKG is  ordered today.  The ekg ordered today demonstrates   05/19/2022- NSR, non specific T wave changes  Recent Labs: 04/01/2022: B Natriuretic Peptide 22.5 04/13/2022: ALT 13 04/27/2022: TSH 1.25 05/11/2022: BUN 11; Creatinine, Ser 0.93; Hemoglobin 13.3; Platelets 253; Potassium 3.5; Sodium 140   Recent Lipid Panel No results found for: "CHOL", "TRIG", "HDL", "CHOLHDL", "VLDL", "LDLCALC", "LDLDIRECT"   Risk Assessment/Calculations:     Physical Exam:     VS:   Vitals:   06/01/22 1020  BP: (!) 146/98  Pulse: 86  SpO2: 100%    BP (!) 146/98 (BP Location: Left Arm, Patient Position: Sitting, Cuff Size: Normal)   Pulse 86   Ht 5\' 7"  (1.702 m)   Wt 164 lb 8 oz (74.6 kg)   LMP 04/06/2022   SpO2 100%   BMI 25.76 kg/m     Wt Readings from Last 3 Encounters:  06/01/22 164 lb 8 oz (74.6 kg)  05/19/22 163 lb (73.9 kg)  05/18/22 162 lb 12.8 oz (73.8 kg)     GEN:  Well nourished, well developed in no acute distress HEENT: Normal NECK: No JVD; No carotid bruits LYMPHATICS: No lymphadenopathy CARDIAC: RRR, no murmurs, rubs, gallops RESPIRATORY:  Clear to auscultation without rales, wheezing or rhonchi  ABDOMEN: Soft, non-tender, non-distended MUSCULOSKELETAL:  No edema; No deformity  SKIN: Warm and dry NEUROLOGIC:  Alert and oriented x 3 PSYCHIATRIC:  Normal affect   ASSESSMENT:    HTN: certainly anxiety is playing a role. Will forward to her primary provider. Will start norvasc 10 mg daily and chlorthalidone 25 mg daily. Recommend migraine and anxiety management. Will get renal 05/20/22 considering young age.   PLAN:    In order of problems listed above:  Start Norvasc 10 mg and chlorthalidone 25 mg daily Renal US Follow up 6 months       Medication Adjustments/Labs and Tests Ordered: Current medicines are reviewed at length with the patient today.  Concerns regarding medicines are outlined above.  No orders of the defined types were placed in this encounter.  No orders of the defined types were placed in this encounter.   Patient Instructions          Signed, [place], MD  06/01/2022 10:24 AM    [facility]

## 2022-06-04 ENCOUNTER — Ambulatory Visit (HOSPITAL_COMMUNITY)
Admission: RE | Admit: 2022-06-04 | Discharge: 2022-06-04 | Disposition: A | Discharge status: Home / Self Care | Payer: Medicaid Other | Source: Ambulatory Visit | Attending: Cardiology | Admitting: Cardiology

## 2022-06-04 DIAGNOSIS — I10 Essential (primary) hypertension: Secondary | ICD-10-CM | POA: Insufficient documentation

## 2022-06-26 LAB — BASIC METABOLIC PANEL
BUN/Creatinine Ratio: 11 (ref 9–23)
BUN: 11 mg/dL (ref 6–20)
CO2: 25 mmol/L (ref 20–29)
Calcium: 9.4 mg/dL (ref 8.7–10.2)
Chloride: 102 mmol/L (ref 96–106)
Creatinine, Ser: 1.03 mg/dL — ABNORMAL HIGH (ref 0.57–1.00)
Glucose: 65 mg/dL — ABNORMAL LOW (ref 70–99)
Potassium: 3.7 mmol/L (ref 3.5–5.2)
Sodium: 140 mmol/L (ref 134–144)
eGFR: 74 mL/min/{1.73_m2} (ref >59)

## 2022-06-30 ENCOUNTER — Telehealth: Payer: Self-pay | Admitting: Internal Medicine

## 2022-06-30 NOTE — Telephone Encounter (Signed)
  Pt is calling to get lab result done on 06/25/22

## 2022-06-30 NOTE — Telephone Encounter (Signed)
Spoke with patient who asked about her creatinine level from 12/22 blood work. Explained that Dr. Wyline Mood has not reviewed the lab results yet.

## 2022-07-06 NOTE — Telephone Encounter (Signed)
LMTCB

## 2022-07-06 NOTE — Telephone Encounter (Signed)
Spoke  with patient to inform her of Dr. Nelly Laurence reply: " Labs were not concerning." Patient will stay hydrated.

## 2022-07-29 ENCOUNTER — Other Ambulatory Visit: Payer: Self-pay | Admitting: Family

## 2022-07-29 DIAGNOSIS — I10 Essential (primary) hypertension: Secondary | ICD-10-CM

## 2022-07-29 NOTE — Progress Notes (Signed)
Labs ordered.

## 2022-08-23 ENCOUNTER — Other Ambulatory Visit: Payer: Medicaid Other

## 2022-08-23 ENCOUNTER — Encounter: Payer: Self-pay | Admitting: Orthopedic Surgery

## 2022-08-23 ENCOUNTER — Ambulatory Visit: Payer: Medicaid Other | Admitting: Orthopedic Surgery

## 2022-08-23 VITALS — BP 126/88 | HR 94 | Temp 97.7°F | Resp 16 | Ht 67.0 in | Wt 162.6 lb

## 2022-08-23 DIAGNOSIS — M545 Low back pain, unspecified: Secondary | ICD-10-CM

## 2022-08-23 DIAGNOSIS — I10 Essential (primary) hypertension: Secondary | ICD-10-CM

## 2022-08-23 DIAGNOSIS — R202 Paresthesia of skin: Secondary | ICD-10-CM | POA: Diagnosis not present

## 2022-08-23 NOTE — Patient Instructions (Signed)
Please stretch daily  Look up yoga on internet  Continue to take blood pressure and limit sodium in food

## 2022-08-23 NOTE — Progress Notes (Signed)
Careteam: Patient Care Team: Ngetich, Nelda Bucks, NP as PCP - General (Family Medicine) Janina Mayo, MD as PCP - Cardiology (Cardiology)  Seen by: Windell Moulding, AGNP-C  PLACE OF SERVICE:  [place] Directive information    No Known Allergies  Chief Complaint  Patient presents with   Medical Management of Chronic Issues    4 month follow up.   Immunizations    Discuss the need for HPV vaccine, Covid Booster, and Influenza vaccine. NCIR Verified.      HPI: Patient is a 32 y.o. female seen today for medical management of chronic conditions.   HTN- she reports left arm tingling after starting amlodipine, occurring about 2x/week, blood pressure has improved on amlodipine. Following low sodium diet, BUN/creat 11/1.03 06/25/2022.   She reports intermittent low back pain. Denies recent injury. Admits to lifting 38 y.o. son often. Pain located to mid lower back. Denies numbness down extremities or loss of bowel or bladder. She has not tried anything OTC for pain.   Review of Systems:  Review of Systems  Constitutional: Negative.   HENT: Negative.    Eyes: Negative.   Respiratory: Negative.    Cardiovascular: Negative.   Gastrointestinal: Negative.   Genitourinary: Negative.   Musculoskeletal:  Positive for back pain.  Skin: Negative.   Neurological:  Positive for sensory change.  Psychiatric/Behavioral:  Negative for depression. The patient is not nervous/anxious.     Past Medical History:  Diagnosis Date   Headache    Hypertension    Preterm labor    UTI (urinary tract infection) 2010   during pregnancy   Past Surgical History:  Procedure Laterality Date   CERVICAL CERCLAGE N/A 09/11/2016   Procedure: CERCLAGE CERVICAL;  Surgeon: Woodroe Mode, MD;  Location: [facility];  Service: Gynecology;  Laterality: N/A;   CERVICAL CERCLAGE N/A 11/21/2019   Procedure: CERCLAGE CERVICAL;  Surgeon: Mora Bellman, MD;  Location: [facility];  Service: Gynecology;   Laterality: N/A;   CESAREAN SECTION N/A 11/17/2016   Procedure: CESAREAN SECTION;  Surgeon: Aletha Halim, MD;  Location: [place];  Service: Obstetrics;  Laterality: N/A;   Social History:   reports that she quit smoking about 3 years ago. Her smoking use included cigarettes. She has never used smokeless tobacco. She reports that she does not currently use alcohol after a past usage of about 1.0 standard drink of alcohol per week. She reports that she does not use drugs.  Family History  Problem Relation Age of Onset   Hypertension Father    Heart disease Father        has a pacemaker    Medications: Patient's Medications  New Prescriptions   No medications on file  Previous Medications   AMLODIPINE (NORVASC) 10 MG TABLET    Take 1 tablet (10 mg total) by mouth daily.   CHLORTHALIDONE (HYGROTON) 25 MG TABLET    Take 1 tablet (25 mg total) by mouth daily.  Modified Medications   No medications on file  Discontinued Medications   No medications on file    Physical Exam:  There were no vitals filed for this visit. There is no height or weight on file to calculate BMI. Wt Readings from Last 3 Encounters:  06/01/22 164 lb 8 oz (74.6 kg)  05/19/22 163 lb (73.9 kg)  05/18/22 162 lb 12.8 oz (73.8 kg)    Physical Exam Vitals reviewed.  Constitutional:      General: She is not  in acute distress. HENT:     Head: Normocephalic.  Eyes:     General:        Right eye: No discharge.        Left eye: No discharge.  Neck:     Vascular: No carotid bruit.  Cardiovascular:     Rate and Rhythm: Normal rate and regular rhythm.     Pulses: Normal pulses.     Heart sounds: Normal heart sounds.  Pulmonary:     Effort: Pulmonary effort is normal. No respiratory distress.     Breath sounds: Normal breath sounds. No wheezing.  Abdominal:     General: Bowel sounds are normal.     Palpations: Abdomen is soft.  Musculoskeletal:     Left shoulder: Normal.     Left upper arm:  Normal.     Left elbow: Normal.     Left forearm: Normal.     Left wrist: Normal.     Left hand: Normal.     Cervical back: Normal and neck supple.     Thoracic back: Normal.     Lumbar back: Normal.     Right lower leg: No edema.     Left lower leg: No edema.  Skin:    General: Skin is warm and dry.     Capillary Refill: Capillary refill takes less than 2 seconds.  Neurological:     General: No focal deficit present.     Mental Status: She is alert and oriented to person, place, and time.  Psychiatric:        Mood and Affect: Mood normal.        Behavior: Behavior normal.     Labs reviewed: Basic Metabolic Panel: Recent Labs    04/13/22 1010 04/27/22 1042 05/11/22 1119 06/25/22 1556  NA 138  --  140 140  K 4.0  --  3.5 3.7  CL 105  --  107 102  CO2 27  --  19* 25  GLUCOSE 86  --  77 65*  BUN 10  --  11 11  CREATININE 0.89  --  0.93 1.03*  CALCIUM 9.3  --  9.1 9.4  TSH  --  1.25  --   --    Liver Function Tests: Recent Labs    04/02/22 1652 04/13/22 1010  AST 20 14  ALT 15 13  ALKPHOS 63  --   BILITOT 0.7 0.5  PROT 7.7 7.2  ALBUMIN 4.3  --    No results for input(s): "LIPASE", "AMYLASE" in the last 8760 hours. No results for input(s): "AMMONIA" in the last 8760 hours. CBC: Recent Labs    04/02/22 1652 04/09/22 1940 04/13/22 1010 05/11/22 1119  WBC 4.8 4.8 3.8 3.2*  NEUTROABS 2.2 2.6 1,851  --   HGB 13.3 12.4 12.3 13.3  HCT 41.3 38.5 36.9 41.7  MCV 83.1 83.0 82.4 84.4  PLT 256 230 239 253   Lipid Panel: No results for input(s): "CHOL", "HDL", "LDLCALC", "TRIG", "CHOLHDL", "LDLDIRECT" in the last 8760 hours. TSH: Recent Labs    04/27/22 1042  TSH 1.25   A1C: Lab Results  Component Value Date   HGBA1C 5.2 11/19/2019     Assessment/Plan 1. Essential hypertension - controlled, goal < 130/90 - BUN/creat stable - left arm paresthesia/numbness began when starting medication?  - cont amlodipine - may consider switching to HCTZ  /chlorthalidone - cont low sodium diet  2. Paresthesia of left arm - exam unremarkable - see above  3.  Acute midline low back pain without sciatica - pain to lower mid back > 1 month - exam unremarkable - advised to stretch every morning and increased physical activity - admits to lifting 5 y.o. son - recommend xray lumbar spine if symptoms persist  Total time: 28 minutes. Greater than 50% of total time spent doing patient education regarding HTN, left arm numbness and back pain including symptom/medication management.    Next appt: none Lemma [facility] [place], [facility] [phone]

## 2022-08-24 LAB — CBC WITH DIFFERENTIAL/PLATELET
Absolute Monocytes: 407 cells/uL (ref 200–950)
Basophils Absolute: 19 cells/uL (ref 0–200)
Basophils Relative: 0.5 %
Eosinophils Absolute: 52 cells/uL (ref 15–500)
Eosinophils Relative: 1.4 %
HCT: 38.2 % (ref 35.0–45.0)
Hemoglobin: 12.4 g/dL (ref 11.7–15.5)
Lymphs Abs: 1317 cells/uL (ref 850–3900)
MCH: 26.4 pg — ABNORMAL LOW (ref 27.0–33.0)
MCHC: 32.5 g/dL (ref 32.0–36.0)
MCV: 81.3 fL (ref 80.0–100.0)
MPV: 11.5 fL (ref 7.5–12.5)
Monocytes Relative: 11 %
Neutro Abs: 1906 cells/uL (ref 1500–7800)
Neutrophils Relative %: 51.5 %
Platelets: 294 10*3/uL (ref 140–400)
RBC: 4.7 10*6/uL (ref 3.80–5.10)
RDW: 12.5 % (ref 11.0–15.0)
Total Lymphocyte: 35.6 %
WBC: 3.7 10*3/uL — ABNORMAL LOW (ref 3.8–10.8)

## 2022-08-24 LAB — COMPLETE METABOLIC PANEL WITH GFR
AG Ratio: 1.6 (calc) (ref 1.0–2.5)
ALT: 12 U/L (ref 6–29)
AST: 15 U/L (ref 10–30)
Albumin: 4.4 g/dL (ref 3.6–5.1)
Alkaline phosphatase (APISO): 63 U/L (ref 31–125)
BUN: 12 mg/dL (ref 7–25)
CO2: 24 mmol/L (ref 20–32)
Calcium: 9 mg/dL (ref 8.6–10.2)
Chloride: 104 mmol/L (ref 98–110)
Creat: 0.92 mg/dL (ref 0.50–0.97)
Globulin: 2.8 g/dL (calc) (ref 1.9–3.7)
Glucose, Bld: 83 mg/dL (ref 65–99)
Potassium: 3.8 mmol/L (ref 3.5–5.3)
Sodium: 137 mmol/L (ref 135–146)
Total Bilirubin: 0.4 mg/dL (ref 0.2–1.2)
Total Protein: 7.2 g/dL (ref 6.1–8.1)
eGFR: 84 mL/min/{1.73_m2} (ref >=60)

## 2022-08-24 LAB — TSH: TSH: 0.91 mIU/L

## 2022-09-17 ENCOUNTER — Other Ambulatory Visit: Payer: Self-pay | Admitting: Family

## 2022-09-17 DIAGNOSIS — B379 Candidiasis, unspecified: Secondary | ICD-10-CM

## 2022-09-17 MED ORDER — FLUCONAZOLE 150 MG PO TABS: ORAL_TABLET | ORAL | 0 refills | Status: DC

## 2022-10-31 ENCOUNTER — Other Ambulatory Visit: Payer: Self-pay | Admitting: Family

## 2022-10-31 DIAGNOSIS — I10 Essential (primary) hypertension: Secondary | ICD-10-CM

## 2022-12-13 ENCOUNTER — Encounter: Payer: Self-pay | Admitting: Internal Medicine

## 2022-12-13 ENCOUNTER — Ambulatory Visit: Payer: Medicaid Other | Attending: Internal Medicine | Admitting: Internal Medicine

## 2022-12-13 VITALS — BP 144/84 | HR 90 | Ht 67.0 in | Wt 165.2 lb

## 2022-12-13 DIAGNOSIS — I10 Essential (primary) hypertension: Secondary | ICD-10-CM

## 2022-12-13 DIAGNOSIS — R000 Tachycardia, unspecified: Secondary | ICD-10-CM

## 2022-12-13 MED ORDER — AMLODIPINE BESYLATE 5 MG PO TABS: 5.0000 mg | ORAL_TABLET | Freq: Every day | ORAL | 3 refills | Status: DC

## 2022-12-13 MED ORDER — CHLORTHALIDONE 25 MG PO TABS: 25.0000 mg | ORAL_TABLET | Freq: Every day | ORAL | 3 refills | Status: DC

## 2022-12-13 NOTE — Patient Instructions (Signed)
Medication Instructions:  Decrease Norvasc to 5 mg daily Start Chlorthalidone 25 mg daily *If you need a refill on your cardiac medications before your next appointment, please call your pharmacy*  Follow-Up: At [facility] you and your health needs are our priority.  As part of our continuing mission to provide you with exceptional heart care, we have created designated Provider Care Teams.  These Care Teams include your primary Cardiologist (physician) and Advanced Practice Providers (APPs -  Physician Assistants and Nurse Practitioners) who all work together to provide you with the care you need, when you need it.  We recommend signing up for the patient portal called "MyChart".  Sign up information is provided on this After Visit Summary.  MyChart is used to connect with patients for Virtual Visits (Telemedicine).  Patients are able to view lab/test results, encounter notes, upcoming appointments, etc.  Non-urgent messages can be sent to your provider as well.   To learn more about what you can do with MyChart, go to [email or web address].    Your next appointment:   6 month(s)  Provider:   Maisie Fus, MD

## 2022-12-13 NOTE — Progress Notes (Signed)
Cardiology Office Note:    Date:  12/13/2022   ID:  Shatoya Fink, DOB [birth year], MRN [id number]  PCP:  Caesar Bookman, NP   [facility] Providers Cardiologist:  Maisie Fus, MD     Referring MD: Caesar Bookman, NP   No chief complaint on file. HTN  History of Present Illness:    Tamara Cowan is a 32 y.o. female with a hx of HTN, ED referral for CP. She had a dizzy spell at [facility] and then things got worse. Had hypertensive Bps 160/110 mmhg. Hope Bps normal.  ECG showed mild sinus tachycardia. TSH nl. Normal crt. Normal Hgb. Was seen at [facility], negative CT PE for PE. Ecg shows NSR. The dizzy spell happened once. Had palpitations with coffee. This is all new. This has resolved. She notes migraine symptoms. Had headache, and arm numbness. Notes anxiousness.  Interim Hx 06/01/2022 Her blood pressure increased. Off the amlodipine for two weeks.  Interim Hx 12/13/2022 Last visit started norvasc 10 mg and chlorthalidone 25 mg daily.  Chlorthalidone not on her list?  DC reason stated patient preference. She does not recall taking it. BP at home are ~ 140/90 mmHg. She says the amlodipine 2-3 x per week. She states it gives her paraesthesias of her UE. She asked to reduce the dose  Past Medical History:  Diagnosis Date   Headache    Hypertension    Preterm labor    UTI (urinary tract infection) 2010   during pregnancy    Past Surgical History:  Procedure Laterality Date   CERVICAL CERCLAGE N/A 09/11/2016   Procedure: CERCLAGE CERVICAL;  Surgeon: Adam Phenix, MD;  Location: [facility];  Service: Gynecology;  Laterality: N/A;   CERVICAL CERCLAGE N/A 11/21/2019   Procedure: CERCLAGE CERVICAL;  Surgeon: Catalina Antigua, MD;  Location: [facility];  Service: Gynecology;  Laterality: N/A;   CESAREAN SECTION N/A 11/17/2016   Procedure: CESAREAN SECTION;  Surgeon: [place] Bing, MD;  Location: [facility];  Service: Obstetrics;  Laterality: N/A;    Current  Medications: Current Outpatient Medications on File Prior to Visit  Medication Sig Dispense Refill   fluconazole (DIFLUCAN) 150 MG tablet Take one tablet by mouth  x 1 dose then repeat in one week. (Patient not taking: Reported on 12/13/2022) 2 tablet 0   No current facility-administered medications on file prior to visit.     Allergies:   Patient has no known allergies.   Social History   Socioeconomic History   Marital status: Single    Spouse name: Not on file   Number of children: Not on file   Years of education: Not on file   Highest education level: Not on file  Occupational History   Not on file  Tobacco Use   Smoking status: Former    Years: .5    Types: Cigarettes    Quit date: 10/24/2018    Years since quitting: 4.1   Smokeless tobacco: Never   Tobacco comments:    1-2 PER DAY  Vaping Use   Vaping Use: Never used  Substance and Sexual Activity   Alcohol use: Not Currently    Alcohol/week: 1.0 standard drink of alcohol    Types: 1 Glasses of wine per week    Comment: occasionally   Drug use: No   Sexual activity: Yes    Birth control/protection: None  Other Topics Concern   Not on file  Social History Narrative   Not on file  Social Determinants of Health   Financial Resource Strain: Not on file  Food Insecurity: No Food Insecurity (09/02/2021)   Hunger Vital Sign    Worried About Running Out of Food in the Last Year: Never true    Ran Out of Food in the Last Year: Never true  Transportation Needs: No Transportation Needs (09/02/2021)   PRAPARE - Administrator, Civil Service (Medical): No    Lack of Transportation (Non-Medical): No  Physical Activity: Not on file  Stress: Not on file  Social Connections: Not on file     Family History: [name] family history includes Heart disease in her father; Hypertension in her father.  ROS:   Please see the history of present illness.     All other systems reviewed and are  negative.  EKGs/Labs/Other Studies Reviewed:    The following studies were reviewed today:   EKG:  EKG is  ordered today.  The ekg ordered today demonstrates   05/19/2022- NSR, non specific T wave changes  12/13/2022- NSR  Recent Labs: 04/01/2022: B Natriuretic Peptide 22.5 08/23/2022: ALT 12; BUN 12; Creat 0.92; Hemoglobin 12.4; Platelets 294; Potassium 3.8; Sodium 137; TSH 0.91   Recent Lipid Panel No results found for: "CHOL", "TRIG", "HDL", "CHOLHDL", "VLDL", "LDLCALC", "LDLDIRECT"   Risk Assessment/Calculations:     Physical Exam:    VS:   Vitals:   12/13/22 0916  BP: (!) 144/84  Pulse: 90  SpO2: 99%     Wt Readings from Last 3 Encounters:  12/13/22 165 lb 3.2 oz (74.9 kg)  08/23/22 162 lb 9.6 oz (73.8 kg)  06/01/22 164 lb 8 oz (74.6 kg)     GEN:  Well nourished, well developed in no acute distress HEENT: Normal NECK: No JVD; No carotid bruits LYMPHATICS: No lymphadenopathy CARDIAC: RRR, no murmurs, rubs, gallops RESPIRATORY:  Clear to auscultation without rales, wheezing or rhonchi  ABDOMEN: Soft, non-tender, non-distended MUSCULOSKELETAL:  No edema; No deformity  SKIN: Warm and dry NEUROLOGIC:  Alert and oriented x 3 PSYCHIATRIC:  Normal affect   ASSESSMENT:    HTN: certainly anxiety/migraine is playing a role.  Renal US was normal. - she notes symptoms of paraesthesias with norvasc 10 mg daily , will decrease to 5 mg daily - restart chlorthalidone 25 mg daily  PLAN:    In order of problems listed above:   Norvasc 5 mg daily Chlorthalidone 25 mg daily Follow up 6 months   Medication Adjustments/Labs and Tests Ordered: Current medicines are reviewed at length with the patient today.  Concerns regarding medicines are outlined above.  Orders Placed This Encounter  Procedures   EKG 12-Lead   Meds ordered this encounter  Medications   amLODipine (NORVASC) 5 MG tablet    Sig: Take 1 tablet (5 mg total) by mouth daily.    Dispense:  90  tablet    Refill:  3   chlorthalidone (HYGROTON) 25 MG tablet    Sig: Take 1 tablet (25 mg total) by mouth daily.    Dispense:  90 tablet    Refill:  3    Patient Instructions  Medication Instructions:  Decrease Norvasc to 5 mg daily Start Chlorthalidone 25 mg daily *If you need a refill on your cardiac medications before your next appointment, please call your pharmacy*  Follow-Up: At [facility], you and your health needs are our priority.  As part of our continuing mission to provide you with exceptional heart care, we have created designated  Provider Care Teams.  These Care Teams include your primary Cardiologist (physician) and Advanced Practice Providers (APPs -  Physician Assistants and Nurse Practitioners) who all work together to provide you with the care you need, when you need it.  We recommend signing up for the patient portal called "MyChart".  Sign up information is provided on this After Visit Summary.  MyChart is used to connect with patients for Virtual Visits (Telemedicine).  Patients are able to view lab/test results, encounter notes, upcoming appointments, etc.  Non-urgent messages can be sent to your provider as well.   To learn more about what you can do with MyChart, go to [email or web address].    Your next appointment:   6 month(s)  Provider:   Maisie Fus, MD       Signed, Maisie Fus, MD  12/13/2022 9:40 AM    [facility]

## 2023-01-23 ENCOUNTER — Emergency Department (HOSPITAL_COMMUNITY)
Admission: EM | Admit: 2023-01-23 | Discharge: 2023-01-24 | Disposition: A | Discharge status: Home / Self Care | Payer: Medicaid Other | Attending: Emergency Medicine | Admitting: Emergency Medicine

## 2023-01-23 DIAGNOSIS — N760 Acute vaginitis: Secondary | ICD-10-CM | POA: Diagnosis not present

## 2023-01-23 DIAGNOSIS — N898 Other specified noninflammatory disorders of vagina: Secondary | ICD-10-CM | POA: Diagnosis present

## 2023-01-23 DIAGNOSIS — B9689 Other specified bacterial agents as the cause of diseases classified elsewhere: Secondary | ICD-10-CM | POA: Diagnosis not present

## 2023-01-23 LAB — WET PREP, GENITAL
Sperm: NONE SEEN
Trich, Wet Prep: NONE SEEN
WBC, Wet Prep HPF POC: <10 (ref <10)
Yeast Wet Prep HPF POC: NONE SEEN

## 2023-01-23 MED ORDER — METRONIDAZOLE 500 MG PO TABS: 500.0000 mg | ORAL_TABLET | Freq: Two times a day (BID) | ORAL | 0 refills | Status: DC

## 2023-01-23 MED ORDER — METRONIDAZOLE 500 MG PO TABS
500.0000 mg | ORAL_TABLET | Freq: Once | ORAL | Status: AC
  Administered 2023-01-24: 500 mg via ORAL

## 2023-01-23 NOTE — Discharge Instructions (Signed)
Take Flagyl as prescribed.  Do not drink alcohol with taking this medication.  Recheck with your PCP as needed.

## 2023-01-23 NOTE — ED Provider Notes (Signed)
[place] EMERGENCY DEPARTMENT AT [facility] Provider Note   CSN: [id number] Arrival date & time: 01/23/23  2141     History  Chief Complaint  Patient presents with   Vaginal Discharge    Tamara Cowan is a 32 y.o. female.  32 year old female with complaint of vaginal odor and discharge.  [facility] went to [facility] due to increase in vaginal discharge, was told that she was negative for HIV and STDs, treated with Diflucan although unsure if she had yeast, denies itching or curd-like discharge.  Reports fishy odor which is improving.  Denies abdominal pain, urinary symptoms or fever.       Home Medications Prior to Admission medications   Medication Sig Start Date End Date Taking? Authorizing Provider  metroNIDAZOLE (FLAGYL) 500 MG tablet Take 1 tablet (500 mg total) by mouth 2 (two) times daily. 01/23/23  Yes Jeannie Fend, PA-C  amLODipine (NORVASC) 5 MG tablet Take 1 tablet (5 mg total) by mouth daily. 12/13/22   Maisie Fus, MD  chlorthalidone (HYGROTON) 25 MG tablet Take 1 tablet (25 mg total) by mouth daily. 12/13/22   Maisie Fus, MD  fluconazole (DIFLUCAN) 150 MG tablet Take one tablet by mouth  x 1 dose then repeat in one week. Patient not taking: Reported on 12/13/2022 09/17/22   Ngetich, Donalee Citrin, NP      Allergies    Patient has no known allergies.    Review of Systems   Review of Systems Negative except as per HPI Physical Exam Updated Vital Signs BP (!) 141/105 (BP Location: Left Arm)   Pulse 98   Temp 98.1 F (36.7 C) (Oral)   Resp 18   Ht 5\' 7"  (1.702 m)   Wt 74.8 kg   SpO2 100%   BMI 25.84 kg/m  Physical Exam Vitals and nursing note reviewed.  Constitutional:      General: She is not in acute distress.    Appearance: She is well-developed. She is not diaphoretic.  HENT:     Head: Normocephalic and atraumatic.  Pulmonary:     Effort: Pulmonary effort is normal.  Abdominal:     Palpations: Abdomen is soft.      Tenderness: There is no right CVA tenderness, left CVA tenderness, guarding or rebound.  Skin:    General: Skin is warm and dry.     Findings: No erythema or rash.  Neurological:     Mental Status: She is alert and oriented to person, place, and time.  Psychiatric:        Behavior: Behavior normal.     ED Results / Procedures / Treatments   Labs (all labs ordered are listed, but only abnormal results are displayed) Labs Reviewed  WET PREP, GENITAL - Abnormal; Notable for the following components:      Result Value   Clue Cells Wet Prep HPF POC PRESENT (*)    All other components within normal limits    EKG None  Radiology No results found.  Procedures Procedures    Medications Ordered in ED Medications - No data to display  ED Course/ Medical Decision Making/ A&P                             Medical Decision Making Amount and/or Complexity of Data Reviewed Labs: ordered.   32 year old female with complaint of vaginal odor described as fishy.  It did not improve with [facility].  Differential diagnosis includes bacterial vaginosis, trichomoniasis, yeast.  Self collect wet prep is positive for BV, will treat with Flagyl.        Final Clinical Impression(s) / ED Diagnoses Final diagnoses:  BV (bacterial vaginosis)    Rx / DC Orders ED Discharge Orders          Ordered    metroNIDAZOLE (FLAGYL) 500 MG tablet  2 times daily        01/23/23 2338              Jeannie Fend, PA-C 01/23/23 2338    Sabas Sous, MD 01/24/23 [phone]

## 2023-01-23 NOTE — ED Triage Notes (Signed)
Patient arrived with complaints of vaginal odor after taking Diflucan for a possible yeast infection. Reporting lower back pain. Declines urinary symptoms. States [facility] and [facility].

## 2023-01-24 MED ORDER — METRONIDAZOLE 500 MG PO TABS
ORAL_TABLET | ORAL | Status: AC
  Filled 2023-01-24: qty 1

## 2023-01-26 ENCOUNTER — Telehealth: Payer: Self-pay

## 2023-01-26 NOTE — Transitions of Care (Post Inpatient/ED Visit) (Signed)
   01/26/2023  Name: Tamara Cowan MRN: [id number] DOB: [birth year]  Today's TOC FU Call Status: Today's TOC FU Call Status:: Successful TOC FU Call Competed  Transition Care Management Follow-up Telephone Call Date of Discharge: 01/24/23 Discharge Facility: [facility]) Type of Discharge: Emergency Department Reason for ED Visit: Other: How have you been since you were released from the hospital?: Better Any questions or concerns?: No  Items Reviewed: Did you receive and understand the discharge instructions provided?: Yes Medications obtained,verified, and reconciled?: Yes (Medications Reviewed) Any new allergies since your discharge?: No Dietary orders reviewed?: NA Do you have support at home?: Yes  Medications Reviewed Today: Medications Reviewed Today   Medications were not reviewed in this encounter     Home Care and Equipment/Supplies: Were Home Health Services Ordered?: NA Any new equipment or medical supplies ordered?: NA  Functional Questionnaire: Do you need assistance with bathing/showering or dressing?: No Do you need assistance with meal preparation?: No Do you need assistance with eating?: No Do you have difficulty maintaining continence: No Do you need assistance with getting out of bed/getting out of a chair/moving?: No Do you have difficulty managing or taking your medications?: No  Follow up appointments reviewed: PCP Follow-up appointment confirmed?: Yes Date of PCP follow-up appointment?: 01/29/23 Follow-up Provider: Richarda Blade, NP Specialist Hospital Follow-up appointment confirmed?: NA Do you need transportation to your follow-up appointment?: No Do you understand care options if your condition(s) worsen?: Yes-patient verbalized understanding    SIGNATURE: [facility]

## 2023-01-28 ENCOUNTER — Encounter: Payer: Medicaid Other | Admitting: Family

## 2023-01-28 NOTE — Progress Notes (Signed)
  This encounter was created in error - please disregard. No show 

## 2023-06-20 ENCOUNTER — Ambulatory Visit: Payer: Medicaid Other | Attending: Internal Medicine | Admitting: Internal Medicine

## 2023-06-20 NOTE — Progress Notes (Unsigned)
Cardiology Office Note:    Date:  06/20/2023   ID:  Tamara Cowan, DOB Aug 01, 1990, MRN [id number]  PCP:  Caesar Bookman, NP   [facility] Providers Cardiologist:  Maisie Fus, MD     Referring MD: Caesar Bookman, NP   No chief complaint on file. HTN  History of Present Illness:    Tamara Cowan is a 32 y.o. female with a hx of HTN, ED referral for CP. She had a dizzy spell at [facility] and then things got worse. Had hypertensive Bps 160/110 mmhg. Hope Bps normal.  ECG showed mild sinus tachycardia. TSH nl. Normal crt. Normal Hgb. Was seen at [facility], negative CT PE for PE. Ecg shows NSR. The dizzy spell happened once. Had palpitations with coffee. This is all new. This has resolved. She notes migraine symptoms. Had headache, and arm numbness. Notes anxiousness.  Interim Hx 06/01/2022 Her blood pressure increased. Off the amlodipine for two weeks.  Interim Hx 12/13/2022 Last visit started norvasc 10 mg and chlorthalidone 25 mg daily.  Chlorthalidone not on her list?  DC reason stated patient preference. She does not recall taking it. BP at home are ~ 140/90 mmHg. She says the amlodipine 2-3 x per week. She states it gives her paraesthesias of her UE. She asked to reduce the dose  Past Medical History:  Diagnosis Date   Headache    Hypertension    Preterm labor    UTI (urinary tract infection) 2010   during pregnancy    Past Surgical History:  Procedure Laterality Date   CERVICAL CERCLAGE N/A 09/11/2016   Procedure: CERCLAGE CERVICAL;  Surgeon: Adam Phenix, MD;  Location: [facility]S;  Service: Gynecology;  Laterality: N/A;   CERVICAL CERCLAGE N/A 11/21/2019   Procedure: CERCLAGE CERVICAL;  Surgeon: Catalina Antigua, MD;  Location: [facility] LD ORS;  Service: Gynecology;  Laterality: N/A;   CESAREAN SECTION N/A 11/17/2016   Procedure: CESAREAN SECTION;  Surgeon: [place] Bing, MD;  Location: [facility] BIRTHING SUITES;  Service: Obstetrics;  Laterality: N/A;    Current  Medications: Current Outpatient Medications on File Prior to Visit  Medication Sig Dispense Refill   amLODipine (NORVASC) 5 MG tablet Take 1 tablet (5 mg total) by mouth daily. 90 tablet 3   chlorthalidone (HYGROTON) 25 MG tablet Take 1 tablet (25 mg total) by mouth daily. 90 tablet 3   fluconazole (DIFLUCAN) 150 MG tablet Take one tablet by mouth  x 1 dose then repeat in one week. (Patient not taking: Reported on 12/13/2022) 2 tablet 0   metroNIDAZOLE (FLAGYL) 500 MG tablet Take 1 tablet (500 mg total) by mouth 2 (two) times daily. 14 tablet 0   No current facility-administered medications on file prior to visit.     Allergies:   Patient has no known allergies.   Social History   Socioeconomic History   Marital status: Single    Spouse name: Not on file   Number of children: Not on file   Years of education: Not on file   Highest education level: Not on file  Occupational History   Not on file  Tobacco Use   Smoking status: Former    Current packs/day: 0.00    Types: Cigarettes    Start date: 04/25/2018    Quit date: 10/24/2018    Years since quitting: 4.6   Smokeless tobacco: Never   Tobacco comments:    1-2 PER DAY  Vaping Use   Vaping status: Never Used  Substance and  Sexual Activity   Alcohol use: Not Currently    Alcohol/week: 1.0 standard drink of alcohol    Types: 1 Glasses of wine per week    Comment: occasionally   Drug use: No   Sexual activity: Yes    Birth control/protection: None  Other Topics Concern   Not on file  Social History Narrative   Not on file   Social Drivers of Health   Financial Resource Strain: Not on file  Food Insecurity: No Food Insecurity (09/02/2021)   Hunger Vital Sign    Worried About Running Out of Food in the Last Year: Never true    Ran Out of Food in the Last Year: Never true  Transportation Needs: No Transportation Needs (09/02/2021)   PRAPARE - Administrator, Civil Service (Medical): No    Lack of  Transportation (Non-Medical): No  Physical Activity: Not on file  Stress: Not on file  Social Connections: Not on file     Family History: [name] family history includes Heart disease in her father; Hypertension in her father.  ROS:   Please see the history of present illness.     All other systems reviewed and are negative.  EKGs/Labs/Other Studies Reviewed:    The following studies were reviewed today:   EKG:  EKG is  ordered today.  The ekg ordered today demonstrates   05/19/2022- NSR, non specific T wave changes  12/13/2022- NSR  Recent Labs: 08/23/2022: ALT 12; BUN 12; Creat 0.92; Hemoglobin 12.4; Platelets 294; Potassium 3.8; Sodium 137; TSH 0.91   Recent Lipid Panel No results found for: "CHOL", "TRIG", "HDL", "CHOLHDL", "VLDL", "LDLCALC", "LDLDIRECT"   Risk Assessment/Calculations:     Physical Exam:    VS:   There were no vitals filed for this visit.    Wt Readings from Last 3 Encounters:  01/23/23 165 lb (74.8 kg)  12/13/22 165 lb 3.2 oz (74.9 kg)  08/23/22 162 lb 9.6 oz (73.8 kg)     GEN:  Well nourished, well developed in no acute distress HEENT: Normal NECK: No JVD; No carotid bruits LYMPHATICS: No lymphadenopathy CARDIAC: RRR, no murmurs, rubs, gallops RESPIRATORY:  Clear to auscultation without rales, wheezing or rhonchi  ABDOMEN: Soft, non-tender, non-distended MUSCULOSKELETAL:  No edema; No deformity  SKIN: Warm and dry NEUROLOGIC:  Alert and oriented x 3 PSYCHIATRIC:  Normal affect   ASSESSMENT:    HTN: certainly anxiety/migraine is playing a role.  Renal US was normal. - she notes symptoms of paraesthesias with norvasc 10 mg daily , will decreased to 5 mg daily - cont. chlorthalidone 25 mg daily  PLAN:    In order of problems listed above:    Follow up 6 months ***   Medication Adjustments/Labs and Tests Ordered: Current medicines are reviewed at length with the patient today.  Concerns regarding medicines are outlined  above.  No orders of the defined types were placed in this encounter.  No orders of the defined types were placed in this encounter.   There are no Patient Instructions on file for this visit.   Signed, Maisie Fus, MD  06/20/2023 9:54 AM    [facility]

## 2023-07-28 NOTE — Progress Notes (Deleted)
Cardiology Office Note:    Date:  07/28/2023   ID:  Tamara Cowan, DOB 05-22-1991, MRN [id number]  PCP:  Caesar Bookman, NP   [facility] Providers Cardiologist:  Maisie Fus, MD     Referring MD: Caesar Bookman, NP   No chief complaint on file. HTN  History of Present Illness:    Tamara Cowan is a 33 y.o. female with a hx of HTN, ED referral for CP. She had a dizzy spell at [facility] and then things got worse. Had hypertensive Bps 160/110 mmhg. Hope Bps normal.  ECG showed mild sinus tachycardia. TSH nl. Normal crt. Normal Hgb. Was seen at [facility], negative CT PE for PE. Ecg shows NSR. The dizzy spell happened once. Had palpitations with coffee. This is all new. This has resolved. She notes migraine symptoms. Had headache, and arm numbness. Notes anxiousness.  Interim Hx 06/01/2022 Her blood pressure increased. Off the amlodipine for two weeks.  Interim Hx 12/13/2022 Last visit started norvasc 10 mg and chlorthalidone 25 mg daily.  Chlorthalidone not on her list?  DC reason stated patient preference. She does not recall taking it. BP at home are ~ 140/90 mmHg. She says the amlodipine 2-3 x per week. She states it gives her paraesthesias of her UE. She asked to reduce the dose   Interim hx 07/29/2023   Past Medical History:  Diagnosis Date   Headache    Hypertension    Preterm labor    UTI (urinary tract infection) 2010   during pregnancy    Past Surgical History:  Procedure Laterality Date   CERVICAL CERCLAGE N/A 09/11/2016   Procedure: CERCLAGE CERVICAL;  Surgeon: Adam Phenix, MD;  Location: [facility];  Service: Gynecology;  Laterality: N/A;   CERVICAL CERCLAGE N/A 11/21/2019   Procedure: CERCLAGE CERVICAL;  Surgeon: Catalina Antigua, MD;  Location: [facility] LD ORS;  Service: Gynecology;  Laterality: N/A;   CESAREAN SECTION N/A 11/17/2016   Procedure: CESAREAN SECTION;  Surgeon: [place] Bing, MD;  Location: [facility] BIRTHING SUITES;  Service: Obstetrics;  Laterality:  N/A;    Current Medications: Current Outpatient Medications on File Prior to Visit  Medication Sig Dispense Refill   amLODipine (NORVASC) 5 MG tablet Take 1 tablet (5 mg total) by mouth daily. 90 tablet 3   chlorthalidone (HYGROTON) 25 MG tablet Take 1 tablet (25 mg total) by mouth daily. 90 tablet 3   fluconazole (DIFLUCAN) 150 MG tablet Take one tablet by mouth  x 1 dose then repeat in one week. (Patient not taking: Reported on 12/13/2022) 2 tablet 0   metroNIDAZOLE (FLAGYL) 500 MG tablet Take 1 tablet (500 mg total) by mouth 2 (two) times daily. 14 tablet 0   No current facility-administered medications on file prior to visit.     Allergies:   Patient has no known allergies.   Social History   Socioeconomic History   Marital status: Single    Spouse name: Not on file   Number of children: Not on file   Years of education: Not on file   Highest education level: Not on file  Occupational History   Not on file  Tobacco Use   Smoking status: Former    Current packs/day: 0.00    Types: Cigarettes    Start date: 04/25/2018    Quit date: 10/24/2018    Years since quitting: 4.7   Smokeless tobacco: Never   Tobacco comments:    1-2 PER DAY  Vaping Use   Vaping  status: Never Used  Substance and Sexual Activity   Alcohol use: Not Currently    Alcohol/week: 1.0 standard drink of alcohol    Types: 1 Glasses of wine per week    Comment: occasionally   Drug use: No   Sexual activity: Yes    Birth control/protection: None  Other Topics Concern   Not on file  Social History Narrative   Not on file   Social Drivers of Health   Financial Resource Strain: Not on file  Food Insecurity: No Food Insecurity (09/02/2021)   Hunger Vital Sign    Worried About Running Out of Food in the Last Year: Never true    Ran Out of Food in the Last Year: Never true  Transportation Needs: No Transportation Needs (09/02/2021)   PRAPARE - Administrator, Civil Service (Medical): No     Lack of Transportation (Non-Medical): No  Physical Activity: Not on file  Stress: Not on file  Social Connections: Not on file     Family History: [name] family history includes Heart disease in her father; Hypertension in her father.  ROS:   Please see the history of present illness.     All other systems reviewed and are negative.  EKGs/Labs/Other Studies Reviewed:    The following studies were reviewed today:   EKG:  EKG is  ordered today.  The ekg ordered today demonstrates   05/19/2022- NSR, non specific T wave changes  12/13/2022- NSR  Recent Labs: 08/23/2022: ALT 12; BUN 12; Creat 0.92; Hemoglobin 12.4; Platelets 294; Potassium 3.8; Sodium 137; TSH 0.91   Recent Lipid Panel No results found for: "CHOL", "TRIG", "HDL", "CHOLHDL", "VLDL", "LDLCALC", "LDLDIRECT"   Risk Assessment/Calculations:     Physical Exam:    VS:   There were no vitals filed for this visit.    Wt Readings from Last 3 Encounters:  01/23/23 165 lb (74.8 kg)  12/13/22 165 lb 3.2 oz (74.9 kg)  08/23/22 162 lb 9.6 oz (73.8 kg)     GEN:  Well nourished, well developed in no acute distress HEENT: Normal NECK: No JVD; No carotid bruits LYMPHATICS: No lymphadenopathy CARDIAC: RRR, no murmurs, rubs, gallops RESPIRATORY:  Clear to auscultation without rales, wheezing or rhonchi  ABDOMEN: Soft, non-tender, non-distended MUSCULOSKELETAL:  No edema; No deformity  SKIN: Warm and dry NEUROLOGIC:  Alert and oriented x 3 PSYCHIATRIC:  Normal affect   ASSESSMENT:    HTN: anxiety/migraine is playing a role.  Renal US was normal. - she notes symptoms of paraesthesias with norvasc 10 mg daily , will decrease to 5 mg daily -Continue chlorthalidone 25 mg daily  PLAN:    In order of problems listed above:   Follow up 6 months with an APP   Medication Adjustments/Labs and Tests Ordered: Current medicines are reviewed at length with the patient today.  Concerns regarding medicines are  outlined above.  No orders of the defined types were placed in this encounter.  No orders of the defined types were placed in this encounter.   There are no Patient Instructions on file for this visit.   Signed, Maisie Fus, MD  07/28/2023 3:23 PM    [facility]

## 2023-07-29 ENCOUNTER — Ambulatory Visit: Payer: Medicaid Other | Attending: Internal Medicine | Admitting: Internal Medicine

## 2023-08-03 ENCOUNTER — Emergency Department (HOSPITAL_COMMUNITY): Payer: Medicaid Other

## 2023-08-03 ENCOUNTER — Other Ambulatory Visit: Payer: Self-pay

## 2023-08-03 ENCOUNTER — Emergency Department (HOSPITAL_COMMUNITY)
Admission: EM | Admit: 2023-08-03 | Discharge: 2023-08-03 | Disposition: A | Discharge status: Home / Self Care | Payer: Medicaid Other | Attending: Student | Admitting: Student

## 2023-08-03 ENCOUNTER — Encounter (HOSPITAL_COMMUNITY): Payer: Self-pay

## 2023-08-03 DIAGNOSIS — I10 Essential (primary) hypertension: Secondary | ICD-10-CM | POA: Insufficient documentation

## 2023-08-03 DIAGNOSIS — R0789 Other chest pain: Secondary | ICD-10-CM

## 2023-08-03 DIAGNOSIS — E876 Hypokalemia: Secondary | ICD-10-CM

## 2023-08-03 DIAGNOSIS — Z79899 Other long term (current) drug therapy: Secondary | ICD-10-CM | POA: Diagnosis not present

## 2023-08-03 DIAGNOSIS — R079 Chest pain, unspecified: Secondary | ICD-10-CM | POA: Diagnosis present

## 2023-08-03 LAB — CBC
HCT: 40.3 % (ref 36.0–46.0)
Hemoglobin: 12.7 g/dL (ref 12.0–15.0)
MCH: 26.8 pg (ref 26.0–34.0)
MCHC: 31.5 g/dL (ref 30.0–36.0)
MCV: 85 fL (ref 80.0–100.0)
Platelets: 234 10*3/uL (ref 150–400)
RBC: 4.74 MIL/uL (ref 3.87–5.11)
RDW: 13.2 % (ref 11.5–15.5)
WBC: 5.7 10*3/uL (ref 4.0–10.5)
nRBC: 0 % (ref 0.0–0.2)

## 2023-08-03 LAB — BASIC METABOLIC PANEL
Anion gap: 6 (ref 5–15)
BUN: 12 mg/dL (ref 6–20)
CO2: 24 mmol/L (ref 22–32)
Calcium: 8.7 mg/dL — ABNORMAL LOW (ref 8.9–10.3)
Chloride: 107 mmol/L (ref 98–111)
Creatinine, Ser: 0.88 mg/dL (ref 0.44–1.00)
GFR, Estimated: >60 mL/min (ref >60)
Glucose, Bld: 84 mg/dL (ref 70–99)
Potassium: 3.3 mmol/L — ABNORMAL LOW (ref 3.5–5.1)
Sodium: 137 mmol/L (ref 135–145)

## 2023-08-03 LAB — TROPONIN I (HIGH SENSITIVITY)
Troponin I (High Sensitivity): <2 ng/L (ref <18)
Troponin I (High Sensitivity): 2 ng/L (ref <18)

## 2023-08-03 LAB — HCG, SERUM, QUALITATIVE: Preg, Serum: NEGATIVE

## 2023-08-03 MED ORDER — METHOCARBAMOL 500 MG PO TABS
750.0000 mg | ORAL_TABLET | Freq: Once | ORAL | Status: AC
  Administered 2023-08-03: 750 mg via ORAL
  Filled 2023-08-03: qty 2

## 2023-08-03 MED ORDER — LIDOCAINE 5 % EX PTCH
1.0000 | MEDICATED_PATCH | CUTANEOUS | Status: DC
  Administered 2023-08-03: 1 via TRANSDERMAL
  Filled 2023-08-03: qty 1

## 2023-08-03 MED ORDER — CHLORTHALIDONE 25 MG PO TABS
25.0000 mg | ORAL_TABLET | Freq: Every day | ORAL | Status: DC
  Administered 2023-08-03: 25 mg via ORAL
  Filled 2023-08-03 (×2): qty 1

## 2023-08-03 MED ORDER — CHLORTHALIDONE 25 MG PO TABS: 25.0000 mg | ORAL_TABLET | Freq: Every day | ORAL | 0 refills | Status: DC

## 2023-08-03 MED ORDER — IOHEXOL 350 MG/ML SOLN
100.0000 mL | Freq: Once | INTRAVENOUS | Status: AC | PRN
  Administered 2023-08-03: 100 mL via INTRAVENOUS

## 2023-08-03 MED ORDER — KETOROLAC TROMETHAMINE 15 MG/ML IJ SOLN
15.0000 mg | Freq: Once | INTRAMUSCULAR | Status: AC
  Administered 2023-08-03: 15 mg via INTRAVENOUS
  Filled 2023-08-03: qty 1

## 2023-08-03 MED ORDER — POTASSIUM CHLORIDE CRYS ER 20 MEQ PO TBCR
40.0000 meq | EXTENDED_RELEASE_TABLET | Freq: Once | ORAL | Status: AC
  Administered 2023-08-03: 40 meq via ORAL
  Filled 2023-08-03: qty 2

## 2023-08-03 NOTE — Discharge Instructions (Addendum)
Please follow-up closely with your primary care doctor and with cardiology as scheduled.  Begin taking your blood pressure medication daily.  Return to emergency department immediately for any new or worsening symptoms.

## 2023-08-03 NOTE — ED Provider Notes (Signed)
[place] EMERGENCY DEPARTMENT AT [facility] Provider Note   CSN: [id number] Arrival date & time: 08/03/23  1215     History  Chief Complaint  Patient presents with   Chest Pain    Tamara Cowan is a 33 y.o. female.  Patient is a 33 year old female with a past medical history of hypertension who presents to the emergency department with a chief complaint of left-sided chest pain which has been ongoing for approximate the past 24 hours.  Patient notes that she was sitting in her vehicle last night when the pain began.  Patient notes that since onset the pain has not been exertional or positional in nature but notes that it is worse with certain movements of her upper extremities.  She denies any recent falls or blunt chest wall trauma.  Patient does note that approxi-1 month ago she did stop taking her amlodipine and is not currently on any hypertension medications at this time.  She denies any personal cardiac history but notes that she does have a family history of cardiac disease.  She notes that she has had no associated lower extremity pain or edema, hemoptysis, history of DVT or PE, recent long travel or surgeries.  She does note that the pain is worse with deep inspiration.  She denies any viral-like symptoms to include cough, congestion, rhinorrhea, sore throat.  She denies any associated dizziness, lightheadedness or syncope.  She does note that she scheduled an appointment to see a cardiologist in 1 week.   Chest Pain      Home Medications Prior to Admission medications   Medication Sig Start Date End Date Taking? Authorizing Provider  amLODipine (NORVASC) 5 MG tablet Take 1 tablet (5 mg total) by mouth daily. 12/13/22   Maisie Fus, MD  chlorthalidone (HYGROTON) 25 MG tablet Take 1 tablet (25 mg total) by mouth daily. 12/13/22   Maisie Fus, MD  fluconazole (DIFLUCAN) 150 MG tablet Take one tablet by mouth  x 1 dose then repeat in one week. Patient not  taking: Reported on 12/13/2022 09/17/22   Ngetich, Dinah C, NP  metroNIDAZOLE (FLAGYL) 500 MG tablet Take 1 tablet (500 mg total) by mouth 2 (two) times daily. 01/23/23   Jeannie Fend, PA-C      Allergies    Patient has no known allergies.    Review of Systems   Review of Systems  Cardiovascular:  Positive for chest pain.  All other systems reviewed and are negative.   Physical Exam Updated Vital Signs BP (!) 168/117 (BP Location: Left Arm)   Pulse 88   Temp 99.4 F (37.4 C) (Oral)   Resp 18   Ht 5\' 7"  (1.702 m)   Wt 74 kg   SpO2 100%   BMI 25.55 kg/m  Physical Exam Constitutional:      Appearance: Normal appearance. She is well-developed.  HENT:     Head: Normocephalic and atraumatic.     Nose: Nose normal.     Mouth/Throat:     Mouth: Mucous membranes are moist.  Eyes:     Extraocular Movements: Extraocular movements intact.     Conjunctiva/sclera: Conjunctivae normal.     Pupils: Pupils are equal, round, and reactive to light.  Cardiovascular:     Rate and Rhythm: Normal rate and regular rhythm.     Pulses: Normal pulses.     Heart sounds: Normal heart sounds.  Pulmonary:     Effort: Pulmonary effort is normal. No tachypnea, accessory  muscle usage or respiratory distress.     Breath sounds: Normal breath sounds. No decreased breath sounds, wheezing, rhonchi or rales.     Comments: Mild tenderness palpation over mid chest Chest:     Chest wall: No mass or deformity.  Abdominal:     General: Abdomen is flat. Bowel sounds are normal.     Palpations: Abdomen is soft.  Musculoskeletal:        General: Normal range of motion.     Cervical back: Normal range of motion and neck supple.     Right lower leg: No edema.     Left lower leg: No edema.  Skin:    General: Skin is warm and dry.  Neurological:     General: No focal deficit present.     Mental Status: She is alert and oriented to person, place, and time. Mental status is at baseline.  Psychiatric:         Mood and Affect: Mood normal.        Behavior: Behavior normal.        Thought Content: Thought content normal.        Judgment: Judgment normal.     ED Results / Procedures / Treatments   Labs (all labs ordered are listed, but only abnormal results are displayed) Labs Reviewed  BASIC METABOLIC PANEL - Abnormal; Notable for the following components:      Result Value   Potassium 3.3 (*)    Calcium 8.7 (*)    All other components within normal limits  CBC  HCG, SERUM, QUALITATIVE  TROPONIN I (HIGH SENSITIVITY)  TROPONIN I (HIGH SENSITIVITY)    EKG EKG Interpretation Date/Time:  Wednesday August 03 2023 12:35:09 EST Ventricular Rate:  96 PR Interval:  175 QRS Duration:  94 QT Interval:  347 QTC Calculation: 439 R Axis:   -40  Text Interpretation: Sinus rhythm Left axis deviation Borderline T abnormalities, anterior leads No significant change since last tracing Confirmed by Linwood Dibbles [phone]) on 08/03/2023 12:43:02 PM  Radiology DG Chest 2 View Result Date: 08/03/2023 CLINICAL DATA:  Chest pain EXAM: CHEST - 2 VIEW COMPARISON:  05/11/2022 FINDINGS: The heart size and mediastinal contours are within normal limits. Both lungs are clear. The visualized skeletal structures are unremarkable except for mild dextroscoliosis of the lower thoracic and lumbar spine. Trachea midline. IMPRESSION: No active cardiopulmonary disease. Electronically Signed   By: Judie Petit.  Shick M.D.   On: 08/03/2023 13:15    Procedures Procedures    Medications Ordered in ED Medications  lidocaine (LIDODERM) 5 % 1 patch (has no administration in time range)  chlorthalidone (HYGROTON) tablet 25 mg (has no administration in time range)  ketorolac (TORADOL) 15 MG/ML injection 15 mg (has no administration in time range)    ED Course/ Medical Decision Making/ A&P                                 Medical Decision Making Patient is doing well at this time and is stable for discharge home.  Discussed with  patient that workup in the emergency department has been unremarkable.  EKG demonstrated no acute ischemic changes or STEMI and is consistent with previous with no new T wave inversions.  Troponin and serial troponins were negative.  CT scan of the chest demonstrated no signs of pulmonary embolus and no indication for intercranial rhythm or dissection.  Do not suspect ACS at this time  given the patient's negative serial troponins and unremarkable EKG.  Patient has no indication for underlying etiology such as endocarditis.  Symptoms not positional in nature and do not suspect pericarditis or myocarditis.  She had no indication for pneumonia, pneumothorax, hemothorax on CT scan.  Pain is reproducible with palpation.  Suspect musculoskeletal pain at this time.  Patient notes that she did stop taking her amlodipine 1 month ago and has not been on any antihypertensive medication.  Will start her back on chlorthalidone at this time as she has previously tolerated this medication without difficulty.  She was directed to keep her appointment with her cardiologist as scheduled in 1 week.  Patient's potassium was repleted in the emergency department as well.  Strict return precautions were provided for any new or worsening symptoms.  Patient voiced understand to the plan and had no additional questions.  Amount and/or Complexity of Data Reviewed Labs: ordered. Radiology: ordered.  Risk Prescription drug management.           Final Clinical Impression(s) / ED Diagnoses Final diagnoses:  None    Rx / DC Orders ED Discharge Orders     None         Kathlen Mody 08/03/23 2037    Glendora Score, MD 08/04/23 1215

## 2023-08-03 NOTE — ED Triage Notes (Signed)
C/o sudden onset of left sided cp radiating to left shoulder and left upper back that started when sitting in car last night. Pain is worse with movement of arms.  Denies sob.

## 2023-08-03 NOTE — ED Provider Triage Note (Signed)
Emergency Medicine Provider Triage Evaluation Note  Tamara Cowan , a 33 y.o. female  was evaluated in triage.  Pt complains of chest pain.  Patient reports left-sided chest pain that began last night that radiates to the left shoulder blade. Pain is worse with movement and deep breaths. SOB when the pain worsens with movement.    Review of Systems  Positive: Chest pain worse with movement Negative: Abdominal pain  Physical Exam  BP (!) 171/108 (BP Location: Right Arm)   Pulse 92   Temp 98.5 F (36.9 C) (Oral)   Resp 16   Ht 5\' 7"  (1.702 m)   Wt 74 kg   SpO2 100%   BMI 25.55 kg/m  Gen:   Awake, no distress   Resp:  Normal effort  MSK:   Moves extremities without difficulty  Other:    Medical Decision Making  Medically screening exam initiated at 12:45 PM.  Appropriate orders placed.  Dim Meisinger was informed that the remainder of the evaluation will be completed by another provider, this initial triage assessment does not replace that evaluation, and the importance of remaining in the ED until their evaluation is complete.    Maxwell Marion, PA-C 08/03/23 1250

## 2023-08-09 ENCOUNTER — Ambulatory Visit: Payer: Medicaid Other | Attending: Internal Medicine | Admitting: Internal Medicine

## 2024-01-17 ENCOUNTER — Encounter: Admitting: Adult Health

## 2024-01-18 NOTE — Progress Notes (Signed)
 This encounter was created in error - please disregard.

## 2024-01-31 ENCOUNTER — Encounter: Admitting: Family

## 2024-04-02 ENCOUNTER — Ambulatory Visit: Admitting: Family

## 2024-04-02 ENCOUNTER — Encounter: Payer: Self-pay | Admitting: Family

## 2024-04-02 VITALS — BP 162/98 | HR 97 | Temp 97.8°F | Resp 19 | Ht 67.0 in | Wt 183.4 lb

## 2024-04-02 DIAGNOSIS — I10 Essential (primary) hypertension: Secondary | ICD-10-CM | POA: Insufficient documentation

## 2024-04-02 DIAGNOSIS — R41840 Attention and concentration deficit: Secondary | ICD-10-CM | POA: Diagnosis not present

## 2024-04-02 DIAGNOSIS — Z6828 Body mass index (BMI) 28.0-28.9, adult: Secondary | ICD-10-CM | POA: Diagnosis not present

## 2024-04-02 DIAGNOSIS — E663 Overweight: Secondary | ICD-10-CM | POA: Diagnosis not present

## 2024-04-02 MED ORDER — CHLORTHALIDONE 25 MG PO TABS
25.0000 mg | ORAL_TABLET | Freq: Every day | ORAL | 1 refills | Status: AC
Start: 2024-04-02 — End: ?

## 2024-04-02 MED ORDER — LOSARTAN POTASSIUM 25 MG PO TABS: 25.0000 mg | ORAL_TABLET | Freq: Every day | ORAL | 1 refills | Status: DC

## 2024-04-02 NOTE — Progress Notes (Signed)
 Provider: Roxan Plough FNP-C  Tierany Appleby, Roxan BROCKS, NP  Patient Care Team: Ken Bonn, Roxan BROCKS, NP as PCP - General (Family Medicine) Alvan Ronal BRAVO, MD (Inactive) as PCP - Cardiology (Cardiology)  Extended Emergency Contact Information Primary Emergency Contact: [facility] Phone: [phone] Relation: Sister Secondary Emergency Contact: [facility] Phone: [phone] Relation: Sister  Code Status:  Full Code  Goals of care: Advanced Directive information    04/02/2024    1:36 PM  Advanced Directives  Does Patient Have a Medical Advance Directive? No  Would patient like information on creating a medical advance directive? No - Patient declined     Chief Complaint  Patient presents with   Follow-up    Follow up for blood pressure.    Discussed the use of AI scribe software for clinical note transcription with the patient, who gave verbal consent to proceed.  History of Present Illness   Tamara Cowan is a 33 year old female with hypertension who presents for a follow-up on her blood pressure management.  She has not checked her blood pressure at home recently. She started taking losartan after experiencing adverse effects from amlodipine , which caused discomfort in her arm and leg. She stopped amlodipine  and was prescribed losartan at an urgent care visit. She has not experienced any adverse effects from losartan and is interested in continuing this medication. She has not taken her blood pressure medication for the past three to four days as she ran out.  Approximately six months ago, she visited the emergency room due to bloodshot eyes and elevated blood pressure. During that visit, she was given eye cream and noted that her blood pressure was slightly elevated.  She acknowledges a weight gain from 165 to 183 pounds since her last visit, which she associates with her elevated blood pressure. She is not currently engaging in any form of exercise but plans to  start using the gym in her complex to help manage her weight and blood pressure.  She is currently in school and reports feeling stressed, which she believes may be contributing to her elevated blood pressure. 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  She has been experiencing neck pain for about a week, which she initially thought was a 'crook in the neck' but wonders if it might be related to her blood pressure. No issues with looking over her shoulder or typing-related discomfort. She drinks a good amount of water daily and is interested in finding ways to manage stress and improve focus without medication.   Past Medical History:  Diagnosis Date   Headache    Hypertension    Preterm labor    UTI (urinary tract infection) 2010   during pregnancy   Past Surgical History:  Procedure Laterality Date   CERVICAL CERCLAGE N/A 09/11/2016   Procedure: CERCLAGE CERVICAL;  Surgeon: Lynwood KANDICE Solomons, MD;  Location: [facility] OR[facility];  Service: Gynecology;  Laterality: N/A;   CERVICAL CERCLAGE N/A 11/21/2019   Procedure: CERCLAGE CERVICAL;  Surgeon: Alger Gong, MD;  Location: [facility] ORS;  Service: Gynecology;  Laterality: N/A;   CESAREAN SECTION N/A 11/17/2016   Procedure: CESAREAN SECTION;  Surgeon: Izell Harari, MD;  Location: [facility];  Service: Obstetrics;  Laterality: N/A;    No Known Allergies  Outpatient Encounter Medications as of 04/02/2024  Medication Sig   losartan (COZAAR) 25 MG tablet Take 1 tablet (25 mg total) by mouth daily.   chlorthalidone  (HYGROTON ) 25 MG  tablet Take 1 tablet (25 mg total) by mouth daily.   [DISCONTINUED] amLODipine  (NORVASC ) 5 MG tablet Take 1 tablet (5 mg total) by mouth daily. (Patient not taking: Reported on 04/02/2024)   [DISCONTINUED] chlorthalidone  (HYGROTON ) 25 MG tablet Take 1 tablet (25 mg total) by mouth daily. (Patient not taking: Reported on 04/02/2024)   [DISCONTINUED] fluconazole   (DIFLUCAN ) 150 MG tablet Take one tablet by mouth  x 1 dose then repeat in one week. (Patient not taking: Reported on 04/02/2024)   [DISCONTINUED] metroNIDAZOLE  (FLAGYL ) 500 MG tablet Take 1 tablet (500 mg total) by mouth 2 (two) times daily. (Patient not taking: Reported on 04/02/2024)   No facility-administered encounter medications on file as of 04/02/2024.    Review of Systems  Constitutional:  Negative for appetite change, chills, fatigue, fever and unexpected weight change.  HENT:  Negative for congestion, ear discharge, ear pain, hearing loss, nosebleeds, postnasal drip, rhinorrhea, sinus pressure, sinus pain, sneezing, sore throat, tinnitus and trouble swallowing.   Eyes:  Negative for pain, discharge, redness, itching and visual disturbance.  Respiratory:  Negative for cough, chest tightness, shortness of breath and wheezing.   Cardiovascular:  Negative for chest pain, palpitations and leg swelling.  Gastrointestinal:  Negative for abdominal distention, abdominal pain, blood in stool, constipation, diarrhea, nausea and vomiting.  Genitourinary:  Negative for difficulty urinating, dysuria, flank pain, frequency and urgency.  Musculoskeletal:  Negative for arthralgias, back pain, gait problem, joint swelling, myalgias, neck pain and neck stiffness.  Skin:  Negative for color change, pallor and rash.  Neurological:  Negative for dizziness, syncope, speech difficulty, weakness, light-headedness, numbness and headaches.  Hematological:  Does not bruise/bleed easily.  Psychiatric/Behavioral:  Positive for decreased concentration. Negative for agitation, behavioral problems, confusion, hallucinations and sleep disturbance. The patient is not nervous/anxious.     Immunization History  Administered Date(s) Administered   HPV 9-valent 06/05/2018, 09/02/2021   Influenza,inj,Quad PF,6+ Mos 06/05/2018   Influenza-Unspecified 04/07/2022   Tdap 11/20/2016   Pertinent  Health Maintenance Due   Topic Date Due   Influenza Vaccine  10/02/2024 (Originally 02/03/2024)      05/11/2022   11:01 AM 05/11/2022   11:03 AM 05/18/2022    9:13 AM 08/23/2022    9:42 AM 04/02/2024    1:35 PM  Fall Risk  Falls in the past year?   0 0 0  Was there an injury with Fall?   0 0 0  Fall Risk Category Calculator   0 0 0  Fall Risk Category (Retired)   Low     (RETIRED) Patient Fall Risk Level Low fall risk  Low fall risk  Low fall risk     Patient at Risk for Falls Due to   No Fall Risks No Fall Risks No Fall Risks  Fall risk Follow up   Falls evaluation completed  Falls evaluation completed Falls evaluation completed     Data saved with a previous flowsheet row definition   Functional Status Survey:    Vitals:   04/02/24 1338 04/02/24 1345  BP: (!) 144/100 (!) 162/98  Pulse: 97   Resp: 19   Temp: 97.8 F (36.6 C)   SpO2: 99%   Weight: 183 lb 6.4 oz (83.2 kg)   Height: 5' 7 (1.702 m)    Body mass index is 28.72 kg/m. Physical Exam  VITALS: T- 97.8, P- 97, BP- 144/100, SaO2- 99% MEASUREMENTS: Weight- 183. GENERAL: Alert, cooperative, well developed, no acute distress. HEENT: Normocephalic, normal oropharynx, moist mucous membranes,  ears and nose normal. NECK: Supple, no tenderness. CHEST: Clear to auscultation bilaterally, no wheezes, rhonchi, or crackles. CARDIOVASCULAR: Normal heart rate and rhythm, S1 and S2 normal without murmurs. ABDOMEN: Soft, non-tender, non-distended, without organomegaly, normal bowel sounds. EXTREMITIES: No cyanosis or edema. NEUROLOGICAL: Cranial nerves grossly intact, moves all extremities without gross motor or sensory deficit.   Labs reviewed: Recent Labs    08/03/23 1540  NA 137  K 3.3*  CL 107  CO2 24  GLUCOSE 84  BUN 12  CREATININE 0.88  CALCIUM  8.7*   No results for input(s): AST, ALT, ALKPHOS, BILITOT, PROT, ALBUMIN in the last 8760 hours. Recent Labs    08/03/23 1540  WBC 5.7  HGB 12.7  HCT 40.3  MCV 85.0  PLT 234    Lab Results  Component Value Date   TSH 0.91 08/23/2022   Lab Results  Component Value Date   HGBA1C 5.2 11/19/2019   No results found for: CHOL, HDL, LDLCALC, LDLDIRECT, TRIG, CHOLHDL  Significant Diagnostic Results in last 30 days:  No results found.  Assessment/Plan  Hypertension Hypertension with recent elevated readings of 144/100 and 162/98. Previous amlodipine  therapy caused adverse effects, leading to discontinuation. Losartan initiated at urgent care without adverse effects. Recent weight gain and medication non-compliance may contribute to elevated blood pressure. - Discontinue amlodipine . - Start losartan at a lower dose. - Continue chlorthalidone ; send prescription to pharmacy. - Monitor blood pressure daily and record readings. - Schedule follow-up appointment in two weeks to assess blood pressure control and adjust medication if necessary.  Overweight Weight increased from 165 lbs to 183 lbs. Discussed impact of weight on blood pressure and overall health. No current exercise regimen. - Initiate regular exercise routine, utilizing the gym in her complex. - Aim for weight loss of approximately 10 pounds to aid in blood pressure control. - Encourage healthy eating habits and stress management techniques, including listening to relaxation music for 15 minutes daily.  Focus and Concentration Issues Reports difficulty focusing, especially in school. No diagnosis of ADHD. Discussed non-pharmacological strategies to improve focus. - Encourage regular exercise to improve focus and mood. - Promote healthy eating habits to avoid high sugar and fat intake. - Consider referral to psychiatry if focus issues worsen.   Family/ staff Communication: Reviewed plan of care with patient verbalized understanding   Labs/tests ordered: None   Next Appointment: Return in about 2 weeks (around 04/16/2024) for Blood pressure.  Total time: 20 minutes. Greater than 50% of  total time spent doing patient education regarding HTN, Poor concentration ,overweight,health maintenance including symptom/medication management.   Roxan JAYSON Plough, NP

## 2024-04-02 NOTE — Patient Instructions (Signed)
 1.Go to local pharmacy to receive Hepatitis B vaccine.

## 2024-04-16 ENCOUNTER — Encounter: Admitting: Family

## 2024-04-16 NOTE — Progress Notes (Signed)
   This encounter was created in error - please disregard. No show

## 2024-06-06 ENCOUNTER — Ambulatory Visit: Admitting: Family

## 2024-06-06 VITALS — BP 142/88 | HR 79 | Temp 98.7°F | Resp 20 | Ht 67.0 in | Wt 182.4 lb

## 2024-06-06 DIAGNOSIS — B379 Candidiasis, unspecified: Secondary | ICD-10-CM | POA: Diagnosis not present

## 2024-06-06 DIAGNOSIS — I10 Essential (primary) hypertension: Secondary | ICD-10-CM | POA: Diagnosis not present

## 2024-06-06 DIAGNOSIS — Z0000 Encounter for general adult medical examination without abnormal findings: Secondary | ICD-10-CM

## 2024-06-06 MED ORDER — FLUCONAZOLE 150 MG PO TABS: ORAL_TABLET | ORAL | 0 refills | Status: AC

## 2024-06-06 NOTE — Patient Instructions (Signed)
 1.Report to local pharmacy to receive Hepatitis B Vaccine.

## 2024-06-10 NOTE — Progress Notes (Signed)
 Provider: Roxan Plough Cowan   Tamara Cowan, Tamara BROCKS, Tamara Cowan  Patient Care Team: Tamara Cowan, Tamara BROCKS, Tamara Cowan as PCP - General (Family Medicine) Tamara Ronal BRAVO, Tamara Cowan (Inactive) as PCP - Cardiology (Cardiology)  Extended Emergency Contact Information Primary Emergency Contact: [facility] Mobile Phone: [phone] Relation: Sister Secondary Emergency Contact: [facility] Phone: [phone] Relation: Sister  Code Status: Full code Goals of care: Advanced Directive information    06/06/2024    1:36 PM  Advanced Directives  Does Patient Have a Medical Advance Directive? No  Would patient like information on creating a medical advance directive? No - Patient declined     Chief Complaint  Patient presents with   Annual Exam    Physical.    Discussed the use of AI scribe software for clinical note transcription with the patient, who gave verbal consent to proceed.  History of Present Illness   Tamara Cowan is a 33 year old female with hypertension who presents for an annual physical exam.  She has been doing well with no major issues since her last visit in September. Her blood pressure has improved from 162/98 mmHg in September to 142/88 mmHg today. She is currently taking chlorthalidone  25 mg daily and losartan  25 mg daily for hypertension. She attributes the recent elevation in her blood pressure to dietary changes while her mother is visiting, as her mother uses Himalayan salt in cooking. She anticipates that her blood pressure will stabilize once her mother leaves and she resumes her regular diet. At home, her blood pressure readings have been around 130 mmHg.  She mentions that she has not lost any weight despite taking an aerobics class, and her current weight is 182.4 pounds. She has not yet picked up her medication refill for this month but expects it to be refilled soon.  Past Medical History:  Diagnosis Date   Headache    Hypertension    Preterm labor    UTI (urinary  tract infection) 2010   during pregnancy   Past Surgical History:  Procedure Laterality Date   CERVICAL CERCLAGE N/A 09/11/2016   Procedure: CERCLAGE CERVICAL;  Surgeon: Lynwood KANDICE Solomons, Tamara Cowan;  Location: [facility];  Service: Gynecology;  Laterality: N/A;   CERVICAL CERCLAGE N/A 11/21/2019   Procedure: CERCLAGE CERVICAL;  Surgeon: Alger Gong, Tamara Cowan;  Location: [facility];  Service: Gynecology;  Laterality: N/A;   CESAREAN SECTION N/A 11/17/2016   Procedure: CESAREAN SECTION;  Surgeon: Izell Harari, Tamara Cowan;  Location: [facility] BIRTHING SUIT[facility];  Service: Obstetrics;  Laterality: N/A;    No Known Allergies  Allergies as of 06/06/2024   No Known Allergies      Medication List        Accurate as of June 06, 2024 11:59 PM. If you have any questions, ask your nurse or doctor.          chlorthalidone  25 MG tablet Commonly known as: HYGROTON  Take 1 tablet (25 mg total) by mouth daily.   fluconazole  150 MG tablet Commonly known as: DIFLUCAN  Take one by mouth x 1 dose then repeat x 1 dose in one week. Started by: [place] C Eberardo Demello   losartan  25 MG tablet Commonly known as: COZAAR  Take 1 tablet (25 mg total) by mouth daily.        Review of Systems  Constitutional:  Negative for appetite change, chills, fatigue, fever and unexpected weight change.  HENT:  Negative for congestion, dental problem, ear discharge, ear pain, facial swelling, hearing loss, nosebleeds, postnasal drip, rhinorrhea, sinus  pressure, sinus pain, sneezing, sore throat, tinnitus and trouble swallowing.   Eyes:  Negative for pain, discharge, redness, itching and visual disturbance.  Respiratory:  Negative for cough, chest tightness, shortness of breath and wheezing.   Cardiovascular:  Negative for chest pain, palpitations and leg swelling.  Gastrointestinal:  Negative for abdominal distention, abdominal pain, blood in stool, constipation, diarrhea, nausea and vomiting.  Endocrine: Negative for cold intolerance, heat  intolerance, polydipsia, polyphagia and polyuria.  Genitourinary:  Negative for difficulty urinating, dysuria, flank pain, frequency and urgency.  Musculoskeletal:  Negative for arthralgias, back pain, gait problem, joint swelling, myalgias, neck pain and neck stiffness.  Skin:  Negative for color change, pallor, rash and wound.  Neurological:  Negative for dizziness, syncope, speech difficulty, weakness, light-headedness, numbness and headaches.  Hematological:  Does not bruise/bleed easily.  Psychiatric/Behavioral:  Negative for agitation, behavioral problems, confusion, hallucinations, self-injury, sleep disturbance and suicidal ideas. The patient is not nervous/anxious.     Immunization History  Administered Date(s) Administered   HPV 9-valent 06/05/2018, 09/02/2021   Influenza,inj,Quad PF,6+ Mos 06/05/2018   Influenza-Unspecified 04/07/2022   Tdap 11/20/2016   Pertinent  Health Maintenance Due  Topic Date Due   Influenza Vaccine  10/02/2024 (Originally 02/03/2024)      05/11/2022   11:03 AM 05/18/2022    9:13 AM 08/23/2022    9:42 AM 04/02/2024    1:35 PM 06/06/2024    1:35 PM  Fall Risk  Falls in the past year?  0 0 0 0  Was there an injury with Fall?  0  0  0  0  Fall Risk Category Calculator  0 0 0 0  Fall Risk Category (Retired)  Low      (RETIRED) Patient Fall Risk Level Low fall risk  Low fall risk      Patient at Risk for Falls Due to  No Fall Risks No Fall Risks No Fall Risks No Fall Risks  Fall risk Follow up  Falls evaluation completed  Falls evaluation completed Falls evaluation completed Falls evaluation completed     Data saved with a previous flowsheet row definition   Functional Status Survey:    Vitals:   06/06/24 1338  BP: (!) 142/88  Pulse: 79  Resp: 20  Temp: 98.7 F (37.1 C)  SpO2: 98%  Weight: 182 lb 6.4 oz (82.7 kg)  Height: 5' 7 (1.702 m)   Body mass index is 28.57 kg/m. Physical Exam Physical Exam   VITALS: T- 98.7, P- 79, BP- 142/88,  SaO2- 98% MEASUREMENTS: Weight- 182.4. GENERAL: Alert, cooperative, well developed, no acute distress. HEENT: Normocephalic, normal oropharynx, moist mucous membranes, ears and nose normal. NECK: No sinus tenderness. CHEST: Clear to auscultation bilaterally, no wheezes, rhonchi, or crackles. CARDIOVASCULAR: Normal heart rate and rhythm, S1 and S2 normal without murmurs. ABDOMEN: Soft, non-tender, non-distended, without organomegaly, normal bowel sounds. EXTREMITIES: No cyanosis or edema. NEUROLOGICAL: Cranial nerves grossly intact, moves all extremities without gross motor or sensory deficit.  SKIN: No rash,no lesion or erythema   PSYCHIATRY/BEHAVIORAL: Mood stable   Labs reviewed: Recent Labs    08/03/23 1540  NA 137  K 3.3*  CL 107  CO2 24  GLUCOSE 84  BUN 12  CREATININE 0.88  CALCIUM  8.7*   No results for input(s): AST, ALT, ALKPHOS, BILITOT, PROT, ALBUMIN in the last 8760 hours. Recent Labs    08/03/23 1540  WBC 5.7  HGB 12.7  HCT 40.3  MCV 85.0  PLT 234   Lab Results  Component Value Date   TSH 0.91 08/23/2022   Lab Results  Component Value Date   HGBA1C 5.2 11/19/2019   No results found for: CHOL, HDL, LDLCALC, LDLDIRECT, TRIG, CHOLHDL  Significant Diagnostic Results in last 30 days:  No results found.  Assessment/Plan  Essential hypertension Blood pressure is elevated at 142/88 mmHg, improved from previous 162/98 mmHg. Home readings are around 130 mmHg. Current medications include chlorthalidone  25 mg daily and losartan  25 mg daily. Possible dietary influence from mother's use of Himalayan salt. No immediate medication adjustment due to temporary dietary changes. - Continue chlorthalidone  25 mg daily. - Continue losartan  25 mg daily. - Monitor blood pressure at home. - Will reassess blood pressure management in six months.  General Health Maintenance Annual physical exam conducted. Weight is 182.4 lbs. Engages in aerobics class  but no weight loss reported. - Continue regular exercise regimen. - Scheduled fasting blood work for upcoming week.   Family/ staff Communication: Reviewed plan of care with patient Levonest understanding  Labs/tests ordered: - CBC with Differential/Platelet - CMP with eGFR([facility]) - TSH  Next Appointment : Return in about 1 year (around 06/06/2025) for annual Physical examination.   Spent 30 minutes of Face to face and non-face to face with patient  >50% time spent counseling; reviewing medical record; tests; labs; documentation and developing future plan of care.   Tamara JAYSON Plough, Tamara Cowan

## 2024-07-26 ENCOUNTER — Ambulatory Visit: Admitting: Obstetrics & Gynecology

## 2024-07-26 ENCOUNTER — Other Ambulatory Visit (HOSPITAL_COMMUNITY)
Admission: RE | Admit: 2024-07-26 | Discharge: 2024-07-26 | Disposition: A | Source: Ambulatory Visit | Attending: Obstetrics & Gynecology | Admitting: Obstetrics & Gynecology

## 2024-07-26 ENCOUNTER — Encounter: Payer: Self-pay | Admitting: Obstetrics & Gynecology

## 2024-07-26 VITALS — BP 156/113 | HR 98 | Ht 67.0 in | Wt 182.6 lb

## 2024-07-26 DIAGNOSIS — I10 Essential (primary) hypertension: Secondary | ICD-10-CM

## 2024-07-26 DIAGNOSIS — Z01419 Encounter for gynecological examination (general) (routine) without abnormal findings: Secondary | ICD-10-CM | POA: Diagnosis present

## 2024-07-26 NOTE — Progress Notes (Signed)
 Subjective:     Tamara Cowan is a 34 y.o. female here for a routine exam.  Patient's last menstrual period was 07/21/2024. [id number] Birth Control Method:  none Menstrual Calendar(currently): regular  Current complaints: none.   Current acute medical issues:  ^BP today>has not taken her BP meds in 2 days,    Recent Gynecologic History Patient's last menstrual period was 07/21/2024. Last Pap: 2023,  normal Last mammogram: na,    Past Medical History:  Diagnosis Date   Headache    Hypertension    Preterm labor    UTI (urinary tract infection) 2010   during pregnancy    Past Surgical History:  Procedure Laterality Date   CERVICAL CERCLAGE N/A 09/11/2016   Procedure: CERCLAGE CERVICAL;  Surgeon: Lynwood KANDICE Solomons, MD;  Location: [facility];  Service: Gynecology;  Laterality: N/A;   CERVICAL CERCLAGE N/A 11/21/2019   Procedure: CERCLAGE CERVICAL;  Surgeon: Alger Gong, MD;  Location: [facility];  Service: Gynecology;  Laterality: N/A;   CESAREAN SECTION N/A 11/17/2016   Procedure: CESAREAN SECTION;  Surgeon: Izell Harari, MD;  Location: [facility] BIRTHING SUIT[facility];  Service: Obstetrics;  Laterality: N/A;    OB History     Gravida  4   Para  3   Term      Preterm  3   AB  1   Living  1      SAB  0   IAB  1   Ectopic  0   Multiple  0   Live Births  2           Social History   Socioeconomic History   Marital status: Single    Spouse name: Not on file   Number of children: Not on file   Years of education: Not on file   Highest education level: Associate degree: academic program  Occupational History   Not on file  Tobacco Use   Smoking status: Former    Current packs/day: 0.00    Types: Cigarettes    Start date: 04/25/2018    Quit date: 10/24/2018    Years since quitting: 5.7   Smokeless tobacco: Never   Tobacco comments:    1-2 PER DAY  Vaping Use   Vaping status: Never Used  Substance and Sexual Activity   Alcohol use: Not Currently     Alcohol/week: 1.0 standard drink of alcohol    Types: 1 Glasses of wine per week    Comment: occasionally   Drug use: No   Sexual activity: Yes    Birth control/protection: None  Other Topics Concern   Not on file  Social History Narrative   Not on file   Social Drivers of Health   Tobacco Use: Medium Risk (07/26/2024)   Patient History    Smoking Tobacco Use: Former    Smokeless Tobacco Use: Never    Passive Exposure: Not on file  Financial Resource Strain: Low Risk (07/26/2024)   Overall Financial Resource Strain (CARDIA)    Difficulty of Paying Living Expenses: Not very hard  Food Insecurity: No Food Insecurity (07/26/2024)   Epic    Worried About Radiation Protection Practitioner of Food in the Last Year: Never true    Ran Out of Food in the Last Year: Never true  Transportation Needs: No Transportation Needs (07/26/2024)   Epic    Lack of Transportation (Medical): No    Lack of Transportation (Non-Medical): No  Physical Activity: Insufficiently Active (07/26/2024)   Exercise Vital Sign  Days of Exercise per Week: 5 days    Minutes of Exercise per Session: 20 min  Stress: No Stress Concern Present (07/26/2024)   [employer] of Occupational Health - Occupational Stress Questionnaire    Feeling of Stress: Not at all  Social Connections: Socially Isolated (07/26/2024)   Social Connection and Isolation Panel    Frequency of Communication with Friends and Family: More than three times a week    Frequency of Social Gatherings with Friends and Family: Once a week    Attends Religious Services: Never    Database Administrator or Organizations: No    Attends Banker Meetings: Never    Marital Status: Never married  Depression (PHQ2-9): Low Risk (07/26/2024)   Depression (PHQ2-9)    PHQ-2 Score: 0  Alcohol Screen: Low Risk (07/26/2024)   Alcohol Screen    Last Alcohol Screening Score (AUDIT): 2  Housing: Unknown (07/26/2024)   Epic    Unable to Pay for Housing in the Last Year: No     Number of Times Moved in the Last Year: Not on file    Homeless in the Last Year: No  Utilities: Not At Risk (07/26/2024)   Epic    Threatened with loss of utilities: No  Health Literacy: Adequate Health Literacy (07/26/2024)   B1300 Health Literacy    Frequency of need for help with medical instructions: Never    Family History  Problem Relation Age of Onset   Hypertension Paternal Grandfather    Hypertension Paternal Grandmother    Hypertension Father    Heart disease Father        has a pacemaker    Current Medications[1]  Review of Systems  Review of Systems  Constitutional: Negative for fever, chills, weight loss, malaise/fatigue and diaphoresis.  HENT: Negative for hearing loss, ear pain, nosebleeds, congestion, sore throat, neck pain, tinnitus and ear discharge.   Eyes: Negative for blurred vision, double vision, photophobia, pain, discharge and redness.  Respiratory: Negative for cough, hemoptysis, sputum production, shortness of breath, wheezing and stridor.   Cardiovascular: Negative for chest pain, palpitations, orthopnea, claudication, leg swelling and PND.  Gastrointestinal: negative for abdominal pain. Negative for heartburn, nausea, vomiting, diarrhea, constipation, blood in stool and melena.  Genitourinary: Negative for dysuria, urgency, frequency, hematuria and flank pain.  Musculoskeletal: Negative for myalgias, back pain, joint pain and falls.  Skin: Negative for itching and rash.  Neurological: Negative for dizziness, tingling, tremors, sensory change, speech change, focal weakness, seizures, loss of consciousness, weakness and headaches.  Endo/Heme/Allergies: Negative for environmental allergies and polydipsia. Does not bruise/bleed easily.  Psychiatric/Behavioral: Negative for depression, suicidal ideas, hallucinations, memory loss and substance abuse. The patient is not nervous/anxious and does not have insomnia.        Objective:  Blood pressure (!)  156/113, pulse 98, height 5' 7 (1.702 m), weight 182 lb 9.6 oz (82.8 kg), last menstrual period 07/21/2024.   Physical Exam  Vitals reviewed. Constitutional: She is oriented to person, place, and time. She appears well-developed and well-nourished.  HENT:  Head: Normocephalic and atraumatic.        Right Ear: External ear normal.  Left Ear: External ear normal.  Nose: Nose normal.  Mouth/Throat: Oropharynx is clear and moist.  Eyes: Conjunctivae and EOM are normal. Pupils are equal, round, and reactive to light. Right eye exhibits no discharge. Left eye exhibits no discharge. No scleral icterus.  Neck: Normal range of motion. Neck supple. No tracheal deviation present. No  thyromegaly present.  Cardiovascular: Normal rate, regular rhythm, normal heart sounds and intact distal pulses.  Exam reveals no gallop and no friction rub.   No murmur heard. Respiratory: Effort normal and breath sounds normal. No respiratory distress. She has no wheezes. She has no rales. She exhibits no tenderness.  GI: Soft. Bowel sounds are normal. She exhibits no distension and no mass. There is no tenderness. There is no rebound and no guarding.  Genitourinary:  Breasts no masses skin changes or nipple changes bilaterally      Vulva is normal without lesions Vagina is pink moist without discharge Cervix normal in appearance and pap is done Uterus is normal size shape and contour Adnexa is negative with normal sized ovaries   Musculoskeletal: Normal range of motion. She exhibits no edema and no tenderness.  Neurological: She is alert and oriented to person, place, and time. She has normal reflexes. She displays normal reflexes. No cranial nerve deficit. She exhibits normal muscle tone. Coordination normal.  Skin: Skin is warm and dry. No rash noted. No erythema. No pallor.  Psychiatric: She has a normal mood and affect. Her behavior is normal. Judgment and thought content normal.       Medications Ordered at  today's visit: No orders of the defined types were placed in this encounter.   Other orders placed at today's visit: No orders of the defined types were placed in this encounter.    ASSESSMENT + PLAN:    ICD-10-CM   1. Well woman exam with routine gynecological exam  Z01.419     2. Essential hypertension: on meds but hasn't taken in 2 days  I10           Return in about 3 years (around 07/27/2027) for yearly.     [1]  Current Outpatient Medications:    chlorthalidone  (HYGROTON ) 25 MG tablet, Take 1 tablet (25 mg total) by mouth daily., Disp: 90 tablet, Rfl: 1   losartan  (COZAAR ) 25 MG tablet, Take 1 tablet (25 mg total) by mouth daily., Disp: 90 tablet, Rfl: 1   fluconazole  (DIFLUCAN ) 150 MG tablet, Take one by mouth x 1 dose then repeat x 1 dose in one week., Disp: 2 tablet, Rfl: 0

## 2024-07-26 NOTE — Addendum Note (Signed)
 Addended by: ILEAN RUTHERFORD HERO on: 07/26/2024 02:18 PM   Modules accepted: Orders

## 2024-07-31 LAB — CYTOLOGY - PAP
Adequacy: ABSENT
Chlamydia: NEGATIVE
Comment: NEGATIVE
Comment: NEGATIVE
Comment: NORMAL
Diagnosis: NEGATIVE
High risk HPV: NEGATIVE
Neisseria Gonorrhea: NEGATIVE

## 2024-08-10 ENCOUNTER — Emergency Department (HOSPITAL_COMMUNITY)
Admission: EM | Admit: 2024-08-10 | Discharge: 2024-08-10 | Disposition: A | Discharge status: Home / Self Care | Source: Home / Self Care | Attending: Emergency Medicine | Admitting: Emergency Medicine

## 2024-08-10 ENCOUNTER — Other Ambulatory Visit: Payer: Self-pay

## 2024-08-10 ENCOUNTER — Ambulatory Visit: Payer: Self-pay

## 2024-08-10 ENCOUNTER — Emergency Department (HOSPITAL_COMMUNITY)

## 2024-08-10 DIAGNOSIS — I10 Essential (primary) hypertension: Secondary | ICD-10-CM

## 2024-08-10 LAB — BASIC METABOLIC PANEL WITH GFR
Anion gap: 8 (ref 5–15)
BUN: 10 mg/dL (ref 6–20)
CO2: 26 mmol/L (ref 22–32)
Calcium: 8.9 mg/dL (ref 8.9–10.3)
Chloride: 104 mmol/L (ref 98–111)
Creatinine, Ser: 0.97 mg/dL (ref 0.44–1.00)
GFR, Estimated: >60 mL/min
Glucose, Bld: 86 mg/dL (ref 70–99)
Potassium: 3.5 mmol/L (ref 3.5–5.1)
Sodium: 138 mmol/L (ref 135–145)

## 2024-08-10 LAB — HCG, SERUM, QUALITATIVE: Preg, Serum: NEGATIVE

## 2024-08-10 LAB — CBC
HCT: 39.1 % (ref 36.0–46.0)
Hemoglobin: 12.4 g/dL (ref 12.0–15.0)
MCH: 27 pg (ref 26.0–34.0)
MCHC: 31.7 g/dL (ref 30.0–36.0)
MCV: 85 fL (ref 80.0–100.0)
Platelets: 248 10*3/uL (ref 150–400)
RBC: 4.6 MIL/uL (ref 3.87–5.11)
RDW: 13 % (ref 11.5–15.5)
WBC: 4.2 10*3/uL (ref 4.0–10.5)
nRBC: 0 % (ref 0.0–0.2)

## 2024-08-10 LAB — TROPONIN T, HIGH SENSITIVITY: Troponin T High Sensitivity: <6 ng/L (ref 0–19)

## 2024-08-10 MED ORDER — LOSARTAN POTASSIUM 50 MG PO TABS: 50.0000 mg | ORAL_TABLET | Freq: Every day | ORAL | 1 refills | Status: AC

## 2024-08-10 NOTE — ED Provider Triage Note (Signed)
 Emergency Medicine Provider Triage Evaluation Note  Tamara Cowan , a 34 y.o. female  was evaluated in triage.  Pt complains of HTN. Hx of HTN taking losartan .  Went to get her dental extraction recently but was told her BP was too high in the 180s.  Pt does endorse occasional chest pressure and back pressure.  No active CP at this time.  No fever, chills, cough, headache, abd pain  Review of Systems  Positive: As above Negative: As above  Physical Exam  BP (!) 172/122 (BP Location: Right Arm)   Pulse 88   Temp 98 F (36.7 C) (Oral)   Resp 18   LMP 07/21/2024   SpO2 100%  Gen:   Awake, no distress   Resp:  Normal effort  MSK:   Moves extremities without difficulty  Other:    Medical Decision Making  Medically screening exam initiated at 6:07 PM.  Appropriate orders placed.  Lemoyne Scarpati was informed that the remainder of the evaluation will be completed by another provider, this initial triage assessment does not replace that evaluation, and the importance of remaining in the ED until their evaluation is complete.     Nivia Colon, PA-C 08/10/24 [phone]

## 2024-08-10 NOTE — Discharge Instructions (Signed)
 Call your doctor Monday to schedule a follow-up visit to have your blood pressure medication adjusted

## 2024-08-10 NOTE — Telephone Encounter (Signed)
 See triage notes from triage nurse as a FYI

## 2024-08-10 NOTE — Telephone Encounter (Signed)
 I agree with plan to send to ED for further evaluation of chest pain

## 2024-08-10 NOTE — ED Provider Notes (Signed)
 " [facility] EM[facility] AT [facility] Provider Note   CSN: [id number] Arrival date & time: 08/10/24  1734     Patient presents with: Chest Pain and Hypertension   Tamara Cowan is a 34 y.o. female.   34 year old female with history of hypertension presents due to increased blood pressure.  Patient states that she has had this for quite some time.  She takes losartan  as well as hydrochlorothiazide.  Went to the dentist today to have a tooth extraction but could not do it because of her increased blood pressure.  She does not take her blood pressure at home.  States that she has had constant chest discomfort without radiation between her shoulder blades.  States that seems to be worse in the morning.  No exertional component.  No associated nausea or vomiting or diaphoresis..  No severe headaches.  Was on amlodipine  in the past but did not tolerated according to her.  States she has had difficulty following a strict diet for hypertension       Prior to Admission medications  Medication Sig Start Date End Date Taking? Authorizing Provider  chlorthalidone  (HYGROTON ) 25 MG tablet Take 1 tablet (25 mg total) by mouth daily. 04/02/24   Ngetich, Dinah C, NP  fluconazole  (DIFLUCAN ) 150 MG tablet Take one by mouth x 1 dose then repeat x 1 dose in one week. 06/06/24   Ngetich, Dinah C, NP  losartan  (COZAAR ) 25 MG tablet Take 1 tablet (25 mg total) by mouth daily. 04/02/24   Ngetich, Dinah C, NP    Allergies: Patient has no known allergies.    Review of Systems  All other systems reviewed and are negative.   Updated Vital Signs BP (!) 172/122 (BP Location: Right Arm)   Pulse 88   Temp 98 F (36.7 C) (Oral)   Resp 18   LMP 07/21/2024 (Within Weeks)   SpO2 100%   Physical Exam Vitals and nursing note reviewed.  Constitutional:      General: She is not in acute distress.    Appearance: Normal appearance. She is well-developed. She is not toxic-appearing.  HENT:      Head: Normocephalic and atraumatic.  Eyes:     General: Lids are normal.     Conjunctiva/sclera: Conjunctivae normal.     Pupils: Pupils are equal, round, and reactive to light.  Neck:     Thyroid: No thyroid mass.     Trachea: No tracheal deviation.  Cardiovascular:     Rate and Rhythm: Normal rate and regular rhythm.     Heart sounds: Normal heart sounds. No murmur heard.    No gallop.  Pulmonary:     Effort: Pulmonary effort is normal. No respiratory distress.     Breath sounds: Normal breath sounds. No stridor. No decreased breath sounds, wheezing, rhonchi or rales.  Abdominal:     General: There is no distension.     Palpations: Abdomen is soft.     Tenderness: There is no abdominal tenderness. There is no rebound.  Musculoskeletal:        General: No tenderness. Normal range of motion.     Cervical back: Normal range of motion and neck supple.  Skin:    General: Skin is warm and dry.     Findings: No abrasion or rash.  Neurological:     Mental Status: She is alert and oriented to person, place, and time. Mental status is at baseline.     GCS: GCS eye subscore  is 4. GCS verbal subscore is 5. GCS motor subscore is 6.     Cranial Nerves: No cranial nerve deficit.     Sensory: No sensory deficit.     Motor: Motor function is intact.  Psychiatric:        Attention and Perception: Attention normal.        Speech: Speech normal.        Behavior: Behavior normal.     (all labs ordered are listed, but only abnormal results are displayed) Labs Reviewed  BASIC METABOLIC PANEL WITH GFR  CBC  HCG, SERUM, QUALITATIVE  TROPONIN T, HIGH SENSITIVITY    EKG: EKG Interpretation Date/Time:  Friday August 10 2024 19:13:46 EST Ventricular Rate:  80 PR Interval:  181 QRS Duration:  86 QT Interval:  375 QTC Calculation: 433 R Axis:   -37  Text Interpretation: Sinus rhythm Left axis deviation Low voltage, precordial leads Borderline T abnormalities, diffuse leads No significant  change since last tracing Confirmed by Dasie Faden ([id number]) on 08/10/2024 7:17:58 PM  Radiology: DG Chest 2 View Result Date: 08/10/2024 EXAM: 2 VIEW(S) XRAY OF THE CHEST 08/10/2024 06:15:57 PM COMPARISON: 08/03/2023. CLINICAL HISTORY: Chest pressure. FINDINGS: LUNGS AND PLEURA: No focal pulmonary opacity. No pleural effusion. No pneumothorax. HEART AND MEDIASTINUM: No acute abnormality of the cardiac and mediastinal silhouettes. BONES AND SOFT TISSUES: No acute osseous abnormality. IMPRESSION: 1. No acute process. Electronically signed by: Franky Crease MD 08/10/2024 06:22 PM EST RP Workstation: [id number]     Procedures   Medications Ordered in the ED - No data to display                                  Medical Decision Making Amount and/or Complexity of Data Reviewed Labs: ordered. Radiology: ordered. ECG/medicine tests: ordered.   Patient's EKG shows sinus rhythm.  No signs of acute coronary ischemia.  Chest x-ray without evidence of acute process.  Troponin negative.  Labs are reassuring here.  Blood pressure noted.  Will change patient's losartan  to 50 mg from 25 mg.  Will keep her diuretic at this time.  Patient to follow-up with her doctor     Final diagnoses:  None    ED Discharge Orders     None          Dasie Faden, MD 08/10/24 2104  "

## 2024-08-10 NOTE — ED Triage Notes (Signed)
 Pt reports chest pressure and HA and HTN intermittently x 2 years, s/s are worse since she was unable to get dental work done today because of HTN.

## 2024-08-10 NOTE — Telephone Encounter (Signed)
 FYI Only or Action Required?: FYI only for provider: ED advised.  Patient was last seen in primary care on 06/06/2024 by Ngetich, Roxan BROCKS, NP.  Called Nurse Triage reporting Chest Pain.  Symptoms began yesterday.  Interventions attempted: Nothing.  Symptoms are: unchanged.  Triage Disposition: Call EMS 911 Now- refused EMS, will have sibling drive her to ED  Patient/caregiver understands and will follow disposition?: Yes Message from [facility] sent at 08/10/2024 12:07 PM EST  Reason for Triage: Experiencing chest pain/discomfort. She believes it to be due to her blood pressure meds and would like to be seen by Dinah to possibly change the medication   Reason for Disposition  [1] Chest pain lasts > 5 minutes AND [2] age > 30 AND [3] one or more cardiac risk factors (e.g., diabetes, high blood pressure, high cholesterol, obesity with BMI 30 or higher, smoker, or strong family history of heart disease)  Answer Assessment - Initial Assessment Questions Current BP 183/131 with chest pain and pressure since last night  Protocols used: Chest Pain-A-AH
